# Patient Record
Sex: Male | Born: 1980
Health system: Southern US, Community
[De-identification: ages and names within clinical notes are randomized; demographics above are authoritative.]

## PROBLEM LIST (undated history)

## (undated) DIAGNOSIS — I10 Essential (primary) hypertension: Secondary | ICD-10-CM

## (undated) DIAGNOSIS — F988 Other specified behavioral and emotional disorders with onset usually occurring in childhood and adolescence: Secondary | ICD-10-CM

## (undated) DIAGNOSIS — Z8659 Personal history of other mental and behavioral disorders: Secondary | ICD-10-CM

## (undated) DIAGNOSIS — J9801 Acute bronchospasm: Secondary | ICD-10-CM

## (undated) DIAGNOSIS — Z9109 Other allergy status, other than to drugs and biological substances: Secondary | ICD-10-CM

## (undated) HISTORY — DX: Essential (primary) hypertension: I10

## (undated) HISTORY — DX: Acute bronchospasm: J98.01

## (undated) HISTORY — DX: Personal history of other mental and behavioral disorders: Z86.59

## (undated) HISTORY — DX: Other specified behavioral and emotional disorders with onset usually occurring in childhood and adolescence: F98.8

## (undated) HISTORY — DX: Other allergy status, other than to drugs and biological substances: Z91.09

---
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
## 2003-03-29 ENCOUNTER — Emergency Department (HOSPITAL_COMMUNITY): Admission: AD | Admit: 2003-03-29 | Discharge: 2003-03-29 | Payer: Self-pay | Admitting: Family Medicine

## 2003-03-31 ENCOUNTER — Emergency Department (HOSPITAL_COMMUNITY): Admission: AD | Admit: 2003-03-31 | Discharge: 2003-03-31 | Payer: Self-pay | Admitting: Family Medicine

## 2003-06-06 ENCOUNTER — Emergency Department (HOSPITAL_COMMUNITY): Admission: AD | Admit: 2003-06-06 | Discharge: 2003-06-06 | Payer: Self-pay | Admitting: Family Medicine

## 2003-09-17 ENCOUNTER — Emergency Department (HOSPITAL_COMMUNITY): Admission: EM | Admit: 2003-09-17 | Discharge: 2003-09-17 | Payer: Self-pay | Admitting: Family Medicine

## 2003-12-05 ENCOUNTER — Emergency Department (HOSPITAL_COMMUNITY): Admission: EM | Admit: 2003-12-05 | Discharge: 2003-12-05 | Payer: Self-pay | Admitting: Emergency Medicine

## 2005-08-16 ENCOUNTER — Ambulatory Visit: Payer: Self-pay | Admitting: Internal Medicine

## 2005-11-05 ENCOUNTER — Emergency Department (HOSPITAL_COMMUNITY): Admission: EM | Admit: 2005-11-05 | Discharge: 2005-11-05 | Payer: Self-pay | Admitting: Family Medicine

## 2005-12-14 ENCOUNTER — Ambulatory Visit: Payer: Self-pay | Admitting: Internal Medicine

## 2005-12-20 ENCOUNTER — Ambulatory Visit: Payer: Self-pay | Admitting: Internal Medicine

## 2005-12-30 ENCOUNTER — Ambulatory Visit: Payer: Self-pay | Admitting: Internal Medicine

## 2007-03-27 ENCOUNTER — Telehealth (INDEPENDENT_AMBULATORY_CARE_PROVIDER_SITE_OTHER): Payer: Self-pay | Admitting: *Deleted

## 2007-03-27 ENCOUNTER — Emergency Department (HOSPITAL_COMMUNITY): Admission: EM | Admit: 2007-03-27 | Discharge: 2007-03-27 | Payer: Self-pay | Admitting: Emergency Medicine

## 2007-09-25 ENCOUNTER — Telehealth (INDEPENDENT_AMBULATORY_CARE_PROVIDER_SITE_OTHER): Payer: Self-pay | Admitting: *Deleted

## 2008-03-13 ENCOUNTER — Telehealth (INDEPENDENT_AMBULATORY_CARE_PROVIDER_SITE_OTHER): Payer: Self-pay | Admitting: *Deleted

## 2008-03-24 ENCOUNTER — Ambulatory Visit: Payer: Self-pay | Admitting: Internal Medicine

## 2008-03-24 DIAGNOSIS — F909 Attention-deficit hyperactivity disorder, unspecified type: Secondary | ICD-10-CM

## 2008-03-24 DIAGNOSIS — F419 Anxiety disorder, unspecified: Secondary | ICD-10-CM

## 2008-03-24 DIAGNOSIS — F329 Major depressive disorder, single episode, unspecified: Secondary | ICD-10-CM

## 2008-03-24 DIAGNOSIS — I10 Essential (primary) hypertension: Secondary | ICD-10-CM

## 2008-03-24 DIAGNOSIS — J309 Allergic rhinitis, unspecified: Secondary | ICD-10-CM

## 2008-03-24 HISTORY — DX: Allergic rhinitis, unspecified: J30.9

## 2008-03-24 HISTORY — DX: Essential (primary) hypertension: I10

## 2008-03-24 HISTORY — DX: Anxiety disorder, unspecified: F41.9

## 2008-03-24 HISTORY — DX: Attention-deficit hyperactivity disorder, unspecified type: F90.9

## 2008-05-05 ENCOUNTER — Ambulatory Visit: Payer: Self-pay | Admitting: Internal Medicine

## 2008-05-05 DIAGNOSIS — H6120 Impacted cerumen, unspecified ear: Secondary | ICD-10-CM | POA: Insufficient documentation

## 2008-05-10 LAB — CONVERTED CEMR LAB
AST: 22 units/L (ref 0–37)
Basophils Absolute: 0 10*3/uL (ref 0.0–0.1)
Basophils Relative: 0 % (ref 0.0–3.0)
Calcium: 9 mg/dL (ref 8.4–10.5)
Creatinine, Ser: 0.7 mg/dL (ref 0.4–1.5)
Eosinophils Absolute: 0.1 10*3/uL (ref 0.0–0.7)
GFR calc Af Amer: 171 mL/min
GFR calc non Af Amer: 142 mL/min
Hemoglobin: 15.6 g/dL (ref 13.0–17.0)
MCHC: 33.9 g/dL (ref 30.0–36.0)
MCV: 91.6 fL (ref 78.0–100.0)
Neutro Abs: 5.9 10*3/uL (ref 1.4–7.7)
Neutrophils Relative %: 69.2 % (ref 43.0–77.0)
RBC: 5.03 M/uL (ref 4.22–5.81)

## 2008-09-03 ENCOUNTER — Telehealth: Payer: Self-pay | Admitting: Internal Medicine

## 2010-08-13 NOTE — Assessment & Plan Note (Signed)
[facility]                                 ON-CALL NOTE   STONEWALL, DOSS                     MRN:          [id number]  DATE:04/20/2006                            DOB:          06-Sep-1980    Time of interaction at 10:15 p.m.  The phone number is [phone].  The  caller was Jarin Cornfield, mother.  The patient is a 30 year old male  who came home from work not feeling well.  He is achy, has fevers and  chills, no cough.  There has been no runny nose.  Temperature of 101 and  was given Thera-Flu.  It went up to 102, finally given another dose 4  hours later.  The Thera-Flu has acetaminophen 650, pheniramine 20 mg and  phenylephrine 10 mg.  He has a sore throat and is very achy, with no  cough and no runny nose, just freezing to death and feels poorly.  The  mom called because she was worried about his fever, as is his sister,  who is a Engineer, civil (consulting) and told them to take him to the emergency room.   ASSESSMENT:  It is a probable viral URI.   PLAN:  We will give him Tylenol regular 650 mg tablets every 4 hours,  gargle aggressively every half hour tonight with warm salt water, drink  lots of fluids, rest as much as possible, and if no improvement or gets  worse call in the morning for an appointment.  Primary care Addalynn Kumari is  Dr. Drue Novel, home office [place].     Arta Silence, MD  Electronically Signed    RNS/MedQ  DD: 04/20/2006  DT: 04/20/2006  Job #: [phone] [phone]

## 2012-04-20 ENCOUNTER — Encounter: Payer: Self-pay | Admitting: Internal Medicine

## 2012-04-20 DIAGNOSIS — Z0289 Encounter for other administrative examinations: Secondary | ICD-10-CM

## 2012-06-26 ENCOUNTER — Ambulatory Visit (INDEPENDENT_AMBULATORY_CARE_PROVIDER_SITE_OTHER): Payer: BC Managed Care – PPO | Admitting: Internal Medicine

## 2012-06-26 ENCOUNTER — Encounter: Payer: Self-pay | Admitting: Internal Medicine

## 2012-06-26 VITALS — BP 138/84 | HR 76 | Ht 67.0 in | Wt 256.0 lb

## 2012-06-26 DIAGNOSIS — Z0000 Encounter for general adult medical examination without abnormal findings: Secondary | ICD-10-CM

## 2012-06-26 DIAGNOSIS — F329 Major depressive disorder, single episode, unspecified: Secondary | ICD-10-CM

## 2012-06-26 DIAGNOSIS — I10 Essential (primary) hypertension: Secondary | ICD-10-CM

## 2012-06-26 DIAGNOSIS — Z23 Encounter for immunization: Secondary | ICD-10-CM

## 2012-06-26 HISTORY — DX: Encounter for general adult medical examination without abnormal findings: Z00.00

## 2012-06-26 NOTE — Patient Instructions (Addendum)
Come back fasting: CMP CBC TSH FLP--- dx v70 ---- Exercise 3 hour a week

## 2012-06-26 NOTE — Assessment & Plan Note (Addendum)
Td 2003 and today EKG nsr Labs Aware his main problem is obesity also  he has a + FH CAD diet-exercise : advise about a healthy consistent diet, exercise 3 hours/week and refer to a nutritionist BMI discussed

## 2012-06-26 NOTE — Assessment & Plan Note (Signed)
Resolved

## 2012-06-26 NOTE — Assessment & Plan Note (Signed)
H/o HTN, doing well on no meds

## 2012-06-26 NOTE — Progress Notes (Signed)
  Subjective:    Patient ID: Mathew Daniels, male    DOB: 06-03-1980, 32 y.o.   MRN: [id number]  HPI CPX, new pt, last visit > 3 years ago  Past Medical History  Diagnosis Date  . ADD   . HTN   . H/O: depression   . Bronchospasm   . Environmental allergies    Past Surgical History  Procedure Laterality Date  . Tendon repair Right 1998    ankle   History   Social History  . Marital Status: Single    Spouse Name: N/A    Number of Children: 1  . Years of Education: N/A   Occupational History  . finances     Social History Main Topics  . Smoking status: Former Games developer  . Smokeless tobacco: Former Neurosurgeon     Comment: quit smoking 2012, quit chewing 2014  . Alcohol Use: Yes     Comment: socially  . Drug Use: No  . Sexually Active: Not on file   Other Topics Concern  . Not on file   Social History Narrative   Married, 1 daughter born 2012 , 2 step kids   Diet-exercise: fluctuates, large variations of weight, poor portion control              Review of Systems  Respiratory: Negative for cough and shortness of breath.   Cardiovascular: Negative for chest pain and leg swelling.  Gastrointestinal: Negative for abdominal pain and blood in stool.  Genitourinary: Negative for dysuria and hematuria.  Psychiatric/Behavioral:       Currently no depression       Objective:   Physical Exam BP 138/84  Pulse 76  Ht 5\' 7"  (1.702 m)  Wt 256 lb (116.121 kg)  BMI 40.09 kg/m2  SpO2 96%  General -- alert, well-developed, BMI 40, + central obesity.   Neck --no thyromegaly , normal carotid pulse Lungs -- normal respiratory effort, no intercostal retractions, no accessory muscle use, and normal breath sounds.   Heart-- normal rate, regular rhythm, no murmur, and no gallop.   Abdomen--soft, non-tender, no distention, no masses, no HSM, no guarding, and no rigidity.   Extremities-- no pretibial edema bilaterally  Neurologic-- alert & oriented X3 and strength normal in all  extremities. Psych-- Cognition and judgment appear intact. Alert and cooperative with normal attention span and concentration.  not anxious appearing and not depressed appearing.      Assessment & Plan:

## 2012-06-29 ENCOUNTER — Other Ambulatory Visit (INDEPENDENT_AMBULATORY_CARE_PROVIDER_SITE_OTHER): Payer: BC Managed Care – PPO

## 2012-06-29 DIAGNOSIS — Z0000 Encounter for general adult medical examination without abnormal findings: Secondary | ICD-10-CM

## 2012-06-29 LAB — LIPID PANEL
Cholesterol: 140 mg/dL (ref 0–200)
LDL Cholesterol: 88 mg/dL (ref 0–99)
Total CHOL/HDL Ratio: 4
VLDL: 14.2 mg/dL (ref 0.0–40.0)

## 2012-06-29 LAB — COMPREHENSIVE METABOLIC PANEL
Alkaline Phosphatase: 67 U/L (ref 39–117)
BUN: 14 mg/dL (ref 6–23)
Glucose, Bld: 99 mg/dL (ref 70–99)
Total Bilirubin: 0.4 mg/dL (ref 0.3–1.2)

## 2012-06-29 LAB — CBC WITH DIFFERENTIAL/PLATELET
Basophils Relative: 0.1 % (ref 0.0–3.0)
Eosinophils Relative: 0.8 % (ref 0.0–5.0)
HCT: 42.1 % (ref 39.0–52.0)
Lymphs Abs: 1.6 10*3/uL (ref 0.7–4.0)
MCV: 85.6 fl (ref 78.0–100.0)
Monocytes Absolute: 0.6 10*3/uL (ref 0.1–1.0)
Monocytes Relative: 4.8 % (ref 3.0–12.0)
Neutrophils Relative %: 80.9 % — ABNORMAL HIGH (ref 43.0–77.0)
RBC: 4.92 Mil/uL (ref 4.22–5.81)
WBC: 12.1 10*3/uL — ABNORMAL HIGH (ref 4.5–10.5)

## 2012-07-04 ENCOUNTER — Encounter: Payer: Self-pay | Admitting: *Deleted

## 2012-07-13 ENCOUNTER — Ambulatory Visit: Payer: BC Managed Care – PPO | Admitting: Dietician

## 2012-08-06 ENCOUNTER — Encounter: Payer: Self-pay | Admitting: *Deleted

## 2012-08-06 ENCOUNTER — Encounter: Payer: BC Managed Care – PPO | Attending: Internal Medicine | Admitting: *Deleted

## 2012-08-06 DIAGNOSIS — E663 Overweight: Secondary | ICD-10-CM | POA: Insufficient documentation

## 2012-08-06 DIAGNOSIS — Z713 Dietary counseling and surveillance: Secondary | ICD-10-CM | POA: Insufficient documentation

## 2012-08-06 NOTE — Progress Notes (Signed)
Medical Nutrition Therapy:  Appt start time: 1030 end time:  1130.  Assessment:  Primary concern today: Weight management. Patient states a desire for weight loss in order to improve health/set a good example for his kids. He has a history of weight loss using P90x exercise program several years ago, losing 25 pounds in 2 months, but has since regained weight. He just starting using a Ninja blender this week, drinking 2-3 smoothies daily. He has also reduced carbohydrate intake and eliminated soda. He has lost 6 pounds over 1 week. He does report that he sometimes feels light-headed during the day. His biggest issues with eating include, large portions, inconsistent food intake, eating out, and all-or-nothing mind set.   WEIGHT: 250.8 pounds BMI: 39.4  MEDICATIONS: None   DIETARY INTAKE:   Usual eating pattern includes 3 meals and 1-2 snacks per day.  24-hr recall:  B ( AM): Oatmeal, berries, banana, almond milk  Snk ( AM): Sometimes, reduced fat Cheez-its  L ( PM): Smoothie (spinach, Greek yogurt, berries, almond milk, protein powder) Snk ( PM): Same as above D ( PM): Same as lunch, or chicken with vegetables Snk ( PM): None Beverages: water  Usual physical activity: 25 minute workout video (high intensity cardio) 5 days weekly.   Estimated energy needs: 1800 calories 225 g carbohydrates 113 g protein 50 g fat  Progress Towards Goal(s):  In progress.   Nutritional Diagnosis:  [place]-3.3 Overweight/obesity As related to history of excessive energy intake.  As evidenced by BMI 39.4.    Intervention:  Nutrition counseling. We discussed strategies for weight loss, including balancing nutrients (carbs, protein, fat), portion control, healthy snacks, and exercise. Patient was advised about how to use moderation when eating in order to prevent all-or-nothing mind set.   Goals:  1. 1 pound weight loss per week. Goal weight of 240 pounds by next visit.  2. Monitor portion size of food.  3.  Balance nutrients at meals (moderate lean protein, healthy carbs, half plate non-starchy vegetables).  4. Work on exercising 20-30 minutes 5 days weekly.   Handouts given during visit include:  Meal planning card  Monitoring/Evaluation:  Dietary intake, exercise, and body weight in 2 month(s).

## 2012-10-22 ENCOUNTER — Ambulatory Visit: Payer: BC Managed Care – PPO | Admitting: *Deleted

## 2013-01-07 ENCOUNTER — Ambulatory Visit (INDEPENDENT_AMBULATORY_CARE_PROVIDER_SITE_OTHER): Payer: BC Managed Care – PPO | Admitting: Internal Medicine

## 2013-01-07 ENCOUNTER — Encounter: Payer: Self-pay | Admitting: Internal Medicine

## 2013-01-07 VITALS — BP 152/95 | HR 62 | Temp 98.4°F | Wt 255.8 lb

## 2013-01-07 DIAGNOSIS — I10 Essential (primary) hypertension: Secondary | ICD-10-CM

## 2013-01-07 DIAGNOSIS — F329 Major depressive disorder, single episode, unspecified: Secondary | ICD-10-CM

## 2013-01-07 DIAGNOSIS — Z23 Encounter for immunization: Secondary | ICD-10-CM

## 2013-01-07 NOTE — Progress Notes (Signed)
  Subjective:    Patient ID: Mathew Daniels, male    DOB: 10/02/1980, 32 y.o.   MRN: [id number]  HPI Routine office visit Since the last time he was here, he did great with diet and exercise, weight decreased to 242 pounds. Her last few months he has been under a lot of stress mostly due to family issues (daughter with anorexia). Has not been able to follow  through with his healthier lifestyle  Past Medical History  Diagnosis Date  . ADD   . HTN   . H/O: depression   . Bronchospasm   . Environmental allergies    / Past Surgical History  Procedure Laterality Date  . Tendon repair Right 1998    ankle   History   Social History  . Marital Status: Single    Spouse Name: N/A    Number of Children: 1  . Years of Education: N/A   Occupational History  . finances     Social History Main Topics  . Smoking status: Former Games developer  . Smokeless tobacco: Former Neurosurgeon     Comment: quit smoking 2012, quit chewing 2014  . Alcohol Use: Yes     Comment: socially  . Drug Use: No  . Sexual Activity: Not on file   Other Topics Concern  . Not on file   Social History Narrative   Married, 1 daughter born 2012 , 2 step kids                  Review of Systems BP today slightly elevated, not ambulatory BPs. From time to time has difficulty sleeping    Objective:   Physical Exam BP 152/95  Pulse 62  Temp(Src) 98.4 F (36.9 C)  Wt 255 lb 12.8 oz (116.03 kg)  BMI 40.05 kg/m2  SpO2 96% General -- alert, well-developed, NAD.   Lungs -- normal respiratory effort, no intercostal retractions, no accessory muscle use, and normal breath sounds.  Heart-- normal rate, regular rhythm, no murmur.  Extremities-- no pretibial edema bilaterally   Psych-- Cognition and judgment appear intact. Cooperative with normal attention span and concentration. No anxious appearing , no depressed appearing.    Assessment & Plan:  Today , I spent more than 15 min with the patient, >50% of the time  counseling

## 2013-01-07 NOTE — Assessment & Plan Note (Signed)
BP elevated today, taking no medications, encourage To take his ambulatory BPs. He was doing very well with diet and exercise up to a few months ago, encouraged to go back to a healthier lifestyle

## 2013-01-07 NOTE — Assessment & Plan Note (Signed)
Currently under a lot of stress mostly related to his daughter's problems. Counseled to the best of my ability

## 2013-01-07 NOTE — Patient Instructions (Signed)
Check the  blood pressure 2 or 3 times a week, be sure it is between 110/60 and 140/85. If it is consistently higher or lower, let me know  

## 2013-07-05 ENCOUNTER — Telehealth: Payer: Self-pay

## 2013-07-05 NOTE — Telephone Encounter (Signed)
Medication List and allergies:  Reviewed and updated(takes no meds)  90 day supply/mail order: na [facility] [address]  Immunizations due: UTD  A/P:   No changes to FH, PSH or Personal Hx Flu vaccine--10/2013 Tdap--06/2012 Father passed away from DM incident  To Discuss with Provider: Not at this time

## 2013-07-08 ENCOUNTER — Encounter: Payer: BC Managed Care – PPO | Admitting: Internal Medicine

## 2013-08-01 ENCOUNTER — Ambulatory Visit (INDEPENDENT_AMBULATORY_CARE_PROVIDER_SITE_OTHER): Payer: BC Managed Care – PPO | Admitting: Physician Assistant

## 2013-08-01 ENCOUNTER — Encounter: Payer: Self-pay | Admitting: Physician Assistant

## 2013-08-01 VITALS — BP 158/102 | HR 85 | Temp 97.9°F | Resp 16 | Ht 67.0 in | Wt 257.2 lb

## 2013-08-01 DIAGNOSIS — J309 Allergic rhinitis, unspecified: Secondary | ICD-10-CM

## 2013-08-01 DIAGNOSIS — J029 Acute pharyngitis, unspecified: Secondary | ICD-10-CM

## 2013-08-01 HISTORY — DX: Allergic rhinitis, unspecified: J30.9

## 2013-08-01 MED ORDER — FLUTICASONE PROPIONATE 50 MCG/ACT NA SUSP: 2.0000 | Freq: Every day | NASAL | Status: DC

## 2013-08-01 NOTE — Progress Notes (Signed)
Pre visit review using our clinic review tool, if applicable. No additional management support is needed unless otherwise documented below in the visit note/SLS  

## 2013-08-01 NOTE — Patient Instructions (Signed)
Your symptoms seem most likely allergic in nature.  Increase fluid intake.  Alternate tylenol and ibuprofen if needed for throat irritation.  Your strep test was negative but I will send for culture.  Start Allegra daily.  Start using Flonase daily.  Place a humidifier in the bedroom.  Call or return to clinic if symptoms are not improving.

## 2013-08-01 NOTE — Progress Notes (Signed)
Patient presents to clinic today c/o runny nose, post-nasal drip and scratchy throat intermittently over the past 3-4 weeks.  Denies sinus pressure, sinus pain, cough, chest congestion. Denies fever, chills, fatigue.  Has hx of seasonal allergies but does not take a daily medication.    Past Medical History  Diagnosis Date  . ADD   . HTN   . H/O: depression   . Bronchospasm   . Environmental allergies     No current outpatient prescriptions on file prior to visit.   No current facility-administered medications on file prior to visit.    Allergies  Allergen Reactions  . Amoxicillin     Blisters feet hand July 2013, took amoxicillin before w/o problems  . Carvedilol     REACTION: NAUSEA    Family History  Problem Relation Age of Onset  . Colon cancer Neg Hx   . Cancer - Prostate Neg Hx   . CAD Father [id number]    MI x 2 CABG  . CAD Other     GF MI  . Diabetes Father   . Stroke Father     History   Social History  . Marital Status: Single    Spouse Name: N/A    Number of Children: 1  . Years of Education: N/A   Occupational History  . finances     Social History Main Topics  . Smoking status: Former Games developermoker  . Smokeless tobacco: Former NeurosurgeonUser     Comment: quit smoking 2012, quit chewing 2014  . Alcohol Use: Yes     Comment: socially  . Drug Use: No  . Sexual Activity: None   Other Topics Concern  . None   Social History Narrative   Married, 1 daughter born 2012 , 2 step kids                  Review of Systems - See HPI.  All other ROS are negative.  BP 158/102  Pulse 85  Temp(Src) 97.9 F (36.6 C) (Oral)  Resp 16  Ht 5\' 7"  (1.702 m)  Wt 257 lb 4 oz (116.688 kg)  BMI 40.28 kg/m2  SpO2 98%  Physical Exam  Vitals reviewed. Constitutional: He is oriented to person, place, and time and well-developed, well-nourished, and in no distress.  HENT:  Head: Normocephalic and atraumatic.  Right Ear: External ear normal.  Left Ear: External ear normal.   Nose: Nose normal.  Mouth/Throat: Oropharynx is clear and moist. No oropharyngeal exudate.  Eyes: Conjunctivae and EOM are normal. Pupils are equal, round, and reactive to light.  Neck: Neck supple.  Cardiovascular: Normal rate, regular rhythm, normal heart sounds and intact distal pulses.   Pulmonary/Chest: Effort normal and breath sounds normal. No respiratory distress. He has no wheezes. He has no rales. He exhibits no tenderness.  Neurological: He is alert and oriented to person, place, and time. No cranial nerve deficit.  Skin: Skin is warm and dry. No rash noted.  Psychiatric: Affect normal.   Assessment/Plan: Allergic rhinitis Rapid Strap negative. Daily allegra.  Rx Flonase.  Increase fluids.  Humidifier in bedroom.  Call or return to clinic if symptoms are not improving.

## 2013-08-01 NOTE — Assessment & Plan Note (Signed)
Rapid Strap negative. Daily allegra.  Rx Flonase.  Increase fluids.  Humidifier in bedroom.  Call or return to clinic if symptoms are not improving.

## 2013-08-03 LAB — CULTURE, GROUP A STREP: ORGANISM ID, BACTERIA: NORMAL

## 2013-09-03 ENCOUNTER — Ambulatory Visit (HOSPITAL_BASED_OUTPATIENT_CLINIC_OR_DEPARTMENT_OTHER)
Admission: RE | Admit: 2013-09-03 | Discharge: 2013-09-03 | Disposition: A | Discharge status: Home / Self Care | Payer: BC Managed Care – PPO | Source: Ambulatory Visit | Attending: Internal Medicine | Admitting: Internal Medicine

## 2013-09-03 ENCOUNTER — Encounter: Payer: Self-pay | Admitting: Internal Medicine

## 2013-09-03 ENCOUNTER — Ambulatory Visit (INDEPENDENT_AMBULATORY_CARE_PROVIDER_SITE_OTHER): Payer: BC Managed Care – PPO | Admitting: Internal Medicine

## 2013-09-03 ENCOUNTER — Encounter (HOSPITAL_BASED_OUTPATIENT_CLINIC_OR_DEPARTMENT_OTHER): Payer: Self-pay

## 2013-09-03 VITALS — BP 144/91 | HR 90 | Temp 99.2°F | Wt 252.0 lb

## 2013-09-03 DIAGNOSIS — J039 Acute tonsillitis, unspecified: Secondary | ICD-10-CM

## 2013-09-03 DIAGNOSIS — J3501 Chronic tonsillitis: Secondary | ICD-10-CM | POA: Insufficient documentation

## 2013-09-03 DIAGNOSIS — J36 Peritonsillar abscess: Secondary | ICD-10-CM | POA: Insufficient documentation

## 2013-09-03 MED ORDER — IOHEXOL 300 MG/ML  SOLN
75.0000 mL | Freq: Once | INTRAMUSCULAR | Status: AC | PRN
  Administered 2013-09-03: 75 mL via INTRAVENOUS

## 2013-09-03 MED ORDER — PREDNISONE 10 MG PO TABS: ORAL_TABLET | ORAL | Status: DC

## 2013-09-03 MED ORDER — AZITHROMYCIN 250 MG PO TABS: ORAL_TABLET | ORAL | Status: DC

## 2013-09-03 NOTE — Progress Notes (Signed)
Pre visit review using our clinic review tool, if applicable. No additional management support is needed unless otherwise documented below in the visit note. 

## 2013-09-03 NOTE — Patient Instructions (Signed)
Get your blood work before you leave   Will get a CT  Continue with antibiotics  ER if severe symptoms, difficulty breathing or swallowing  Come back in 1 week

## 2013-09-03 NOTE — Progress Notes (Signed)
Subjective:    Patient ID: Mathew Daniels, male    DOB: 12/18/1980, 33 y.o.   MRN: [id number]  DOS:  09/03/2013 Type of  Visit:  Acute visit History:  Micah FlesherWent to the urgent care 08/26/2013 w/ one-day history of sore throat, fever, aches, nausea. Tested positive for strep, was prescribed Zithromax, got significantly better. Symptoms resurface 08/30/2013, the next day went back to the clinic, another strep test was positive and   was prescribed clindamycin. Is here because he is not completely well: Having severe night sweats, subjective fever, still hurting when he swallows, body aches have decreased.    ROS No sinus pain or congestion No chest pain or difficulty breathing Mild cough with no sputum production. No chest pain. No drooling.  Past Medical History  Diagnosis Date  . ADD   . HTN   . H/O: depression   . Bronchospasm   . Environmental allergies     Past Surgical History  Procedure Laterality Date  . Tendon repair Right 1998    ankle    History   Social History  . Marital Status: Single    Spouse Name: N/A    Number of Children: 1  . Years of Education: N/A   Occupational History  . finances     Social History Main Topics  . Smoking status: Former Games developermoker  . Smokeless tobacco: Former NeurosurgeonUser     Comment: quit smoking 2012, quit chewing 2014  . Alcohol Use: Yes     Comment: socially  . Drug Use: No  . Sexual Activity: Not on file   Other Topics Concern  . Not on file   Social History Narrative   Married, 1 daughter born 2012 , 2 step kids                       Medication List       This list is accurate as of: 09/03/13  7:45 PM.  Always use your most recent med list.               azithromycin 250 MG tablet  Commonly known as:  ZITHROMAX Z-PAK  As directed     fluticasone 50 MCG/ACT nasal spray  Commonly known as:  FLONASE  Place 2 sprays into both nostrils daily.     Kelp Tabs  Take 1 each by mouth daily.     multivitamin  tablet  Take 1 tablet by mouth daily. Optimen           Objective:   Physical Exam BP 144/91  Pulse 90  Temp(Src) 99.2 F (37.3 C)  Wt 252 lb (114.306 kg)  SpO2 98% General -- alert, well-developed, NAD.  Neck --neck symmetric, no mass, no TTP HEENT-- Not pale. TMs normal L, R obscured by wax Tthroat  Tonsils quite enlarged, R sli isghtly bigger with abundant   discharge. The left one is red without much discharge. Uvula seems midline. No difficulty breathing or swallowing, no drooling Face symmetric, sinuses not tender to palpation. Nose not congested.  Lungs -- normal respiratory effort, no intercostal retractions, no accessory muscle use, and normal breath sounds.  Heart-- normal rate, regular rhythm, no murmur.   Extremities-- no pretibial edema bilaterally  Neurologic--  alert & oriented X3. Speech normal, gait appropriate for age, strength symmetric and appropriate for age.  Psych-- Cognition and judgment appear intact. Cooperative with normal attention span and concentration. No anxious or depressed appearing.  Assessment & Plan:  Acute tonsillitis, Acute tonsillitis, status post Zithromax and now on clindamycin for 3 days. He feels slightly better however he continue with the symptoms particularly chills at night. DDX includes a strep infection, viral, mono Due to persistence of symptoms, is possible he has to undetected peritonsilar abscess. I discussed this informally with a ENT colleague, a reasonable approach would be to do a CT and if there is no abscess proceed with antibiotics and a low dose of steroids. Plan: CT of the neck Labs-- Mono, throat cx ER if symptoms severe If CT negative , prednisone in addition to recommend antibiotics RTC 1 week Addendum -- would like another zpack, rx sent  --CT neg, LMOM : plan is the same, prednisone sent to his pharmacy  Today , I spent more than  40  min with the patient: >50% of the time counseling regards the  treatment, reasoning behind my advise ans also coordinating his care

## 2013-09-04 LAB — MONONUCLEOSIS SCREEN: MONO SCREEN: NEGATIVE

## 2013-09-05 LAB — CULTURE, GROUP A STREP: Organism ID, Bacteria: NORMAL

## 2013-09-09 ENCOUNTER — Encounter: Payer: Self-pay | Admitting: Internal Medicine

## 2013-09-09 ENCOUNTER — Ambulatory Visit (INDEPENDENT_AMBULATORY_CARE_PROVIDER_SITE_OTHER): Payer: BC Managed Care – PPO | Admitting: Internal Medicine

## 2013-09-09 VITALS — BP 148/83 | HR 80 | Temp 98.4°F | Ht 68.0 in | Wt 251.0 lb

## 2013-09-09 DIAGNOSIS — I10 Essential (primary) hypertension: Secondary | ICD-10-CM

## 2013-09-09 DIAGNOSIS — Z0000 Encounter for general adult medical examination without abnormal findings: Secondary | ICD-10-CM

## 2013-09-09 NOTE — Patient Instructions (Signed)
Schedule labs for next week, fasting FLP, BMP, CBC-- dx V70   If you need more information about a healthy diet, family history of heart disease  visit  the American Heart Association, it  is a Radiographer, therapeuticgreat resource online at:  [email or web address]    Next visit 1 year

## 2013-09-09 NOTE — Assessment & Plan Note (Signed)
BP 148/83, recommend diet and exercise, recheck next year

## 2013-09-09 NOTE — Assessment & Plan Note (Signed)
Td 2014 Labs Aware his main problem is obesity also  he has a + FH CAD; Diet and exercise discussed Concerned about a mole, he already has an appointment to see a skin doctor

## 2013-09-09 NOTE — Progress Notes (Signed)
Subjective:    Patient ID: Mathew Daniels, male    DOB: 10/04/1980, 33 y.o.   MRN: [id number]  DOS:  09/09/2013 Type of  Visit: CPX     ROS Diet, Exercise--  Did well, regained wt, ready to go back to a healthier life style No  CP, SOB Denies  nausea, vomiting diarrhea, blood in the stools (-) wheezing, chest congestion No dysuria, gross hematuria, difficulty urinating  No anxiety, depression    Past Medical History  Diagnosis Date  . ADD   . HTN   . H/O: depression   . Bronchospasm   . Environmental allergies     Past Surgical History  Procedure Laterality Date  . Tendon repair Right 1998    ankle    History   Social History  . Marital Status: Single    Spouse Name: N/A    Number of Children: 1  . Years of Education: N/A   Occupational History  . finances     Social History Main Topics  . Smoking status: Former Games developermoker  . Smokeless tobacco: Former NeurosurgeonUser     Comment: quit smoking 2012, quit chewing 2014  . Alcohol Use: Yes     Comment: socially  . Drug Use: No  . Sexual Activity: Not on file   Other Topics Concern  . Not on file   Social History Narrative   Married, 1 daughter born 2012 , 2 step kids                  Family History  Problem Relation Age of Onset  . Colon cancer Neg Hx   . Cancer - Prostate Neg Hx   . CAD Father [id number]    MI x 2 CABG  . CAD Other     GF MI  . Diabetes Father   . Stroke Father          Medication List       This list is accurate as of: 09/09/13 11:59 PM.  Always use your most recent med list.               Kelp Tabs  Take 1 each by mouth daily.     multivitamin tablet  Take 1 tablet by mouth daily. Optimen     predniSONE 10 MG tablet  Commonly known as:  DELTASONE  3 tabs x 3 days, 2 tabs x 3 days, 1 tab x 3 days           Objective:   Physical Exam BP 148/83  Pulse 80  Temp(Src) 98.4 F (36.9 C) (Oral)  Ht 5\' 8"  (1.727 m)  Wt 251 lb (113.853 kg)  BMI 38.17 kg/m2  SpO2  97%  General -- alert, well-developed, NAD.  Neck --no thyromegaly  HEENT-- Not pale. ; throat symmetric, no redness or discharge.  Lungs -- normal respiratory effort, no intercostal retractions, no accessory muscle use, and normal breath sounds.  Heart-- normal rate, regular rhythm, no murmur.  Abdomen-- Not distended, good bowel sounds,soft, non-tender.  Extremities-- no pretibial edema bilaterally  Neurologic--  alert & oriented X3. Speech normal, gait appropriate for age, strength symmetric and appropriate for age.   Psych-- Cognition and judgment appear intact. Cooperative with normal attention span and concentration. No anxious or depressed appearing.  Skin: Left mid back has a 2.5x0.8 cm here hairy mole     Assessment & Plan:   Tonsillitis, workup was negative, finishing prednisone. Is a lot better.

## 2013-09-09 NOTE — Progress Notes (Signed)
Pre-visit discussion using our clinic review tool. No additional management support is needed unless otherwise documented below in the visit note.  

## 2013-09-10 ENCOUNTER — Telehealth: Payer: Self-pay | Admitting: Internal Medicine

## 2013-09-10 NOTE — Telephone Encounter (Signed)
Relevant patient education assigned to patient using Emmi. ° °

## 2013-09-18 ENCOUNTER — Other Ambulatory Visit: Payer: BC Managed Care – PPO

## 2015-02-02 IMAGING — CT CT NECK W/ CM
4 series · 14 of 33 positions shown, 17 images · IV contrast (omnipaque)
Comparison: None.

CLINICAL DATA: Chronic tonsillitis. Right worse than left sided
tonsillitis. Peritonsillar abscess.

EXAM:
CT NECK WITH CONTRAST
TECHNIQUE: Multidetector CT imaging of the neck was performed using the
standard protocol following the bolus administration of intravenous
contrast.
CONTRAST:  75mL OMNIPAQUE IOHEXOL 300 MG/ML  SOLN

[Series 2: neck 2.0 b31s · axial · 0.51mm/px · z∈[-198,-46]mm · 5 of 114 slices shown, 7 images]
[im 19/114  soft-tissue]
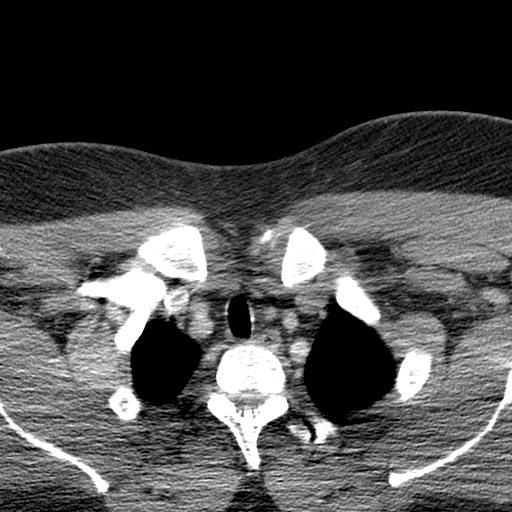
[im 19/114  bone]
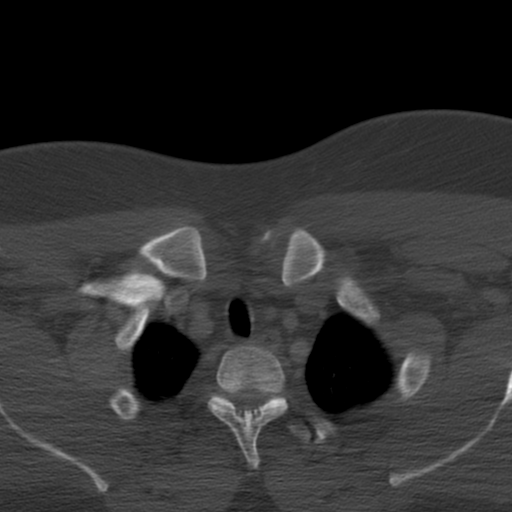
[im 38/114  bone]
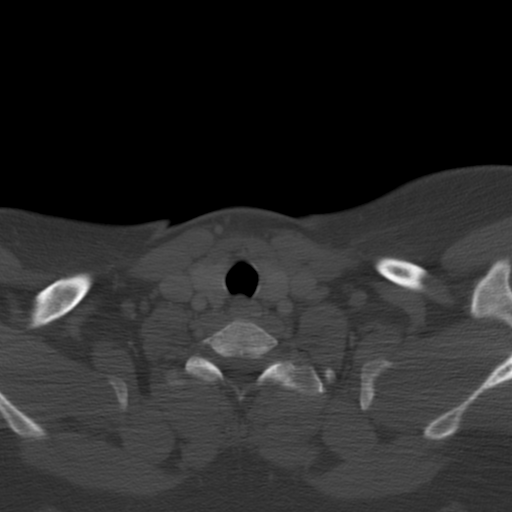
[im 57/114  bone]
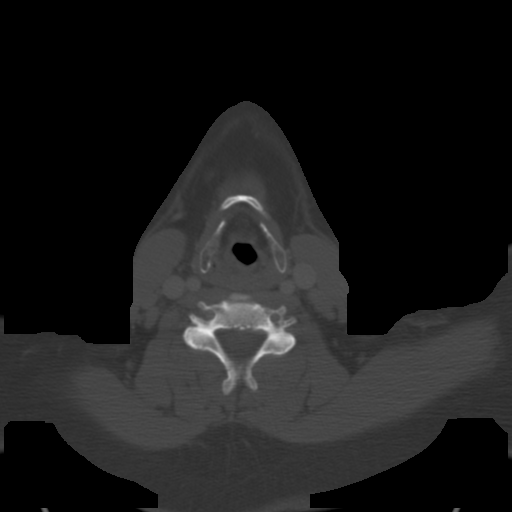
[im 76/114  bone]
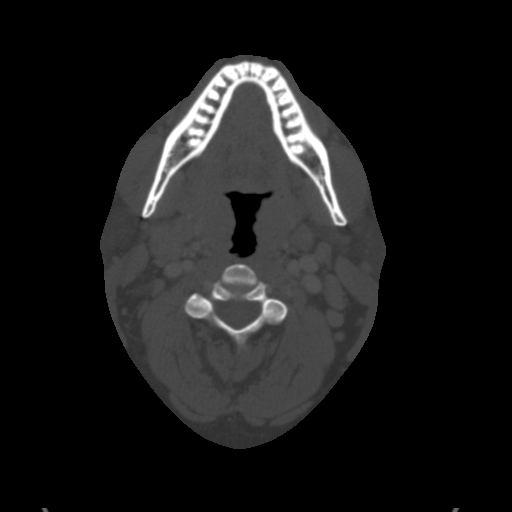
[im 95/114  soft-tissue]
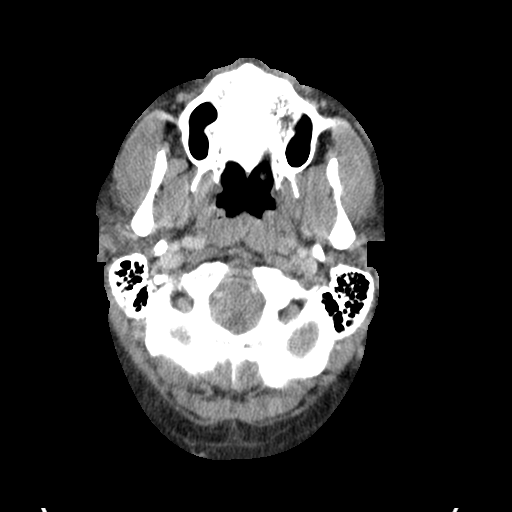
[im 95/114  bone]
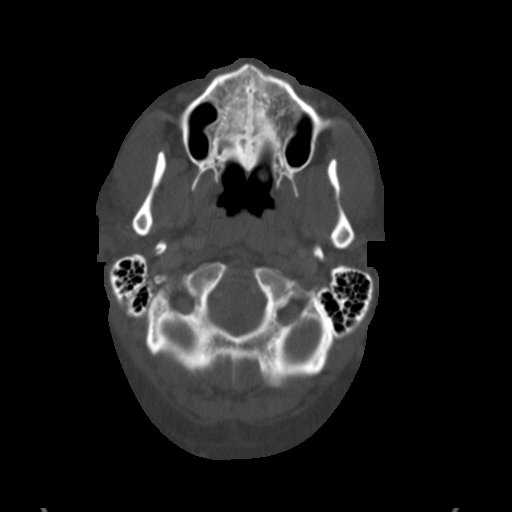

[Series 4: neck 2.0 coronal · coronal · 0.34mm/px · 3 of 125 slices shown]
[im 25/125  bone]
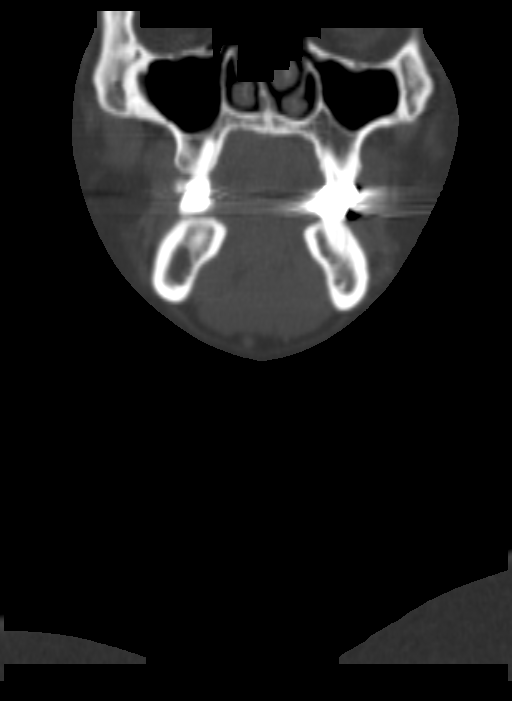
[im 50/125  bone]
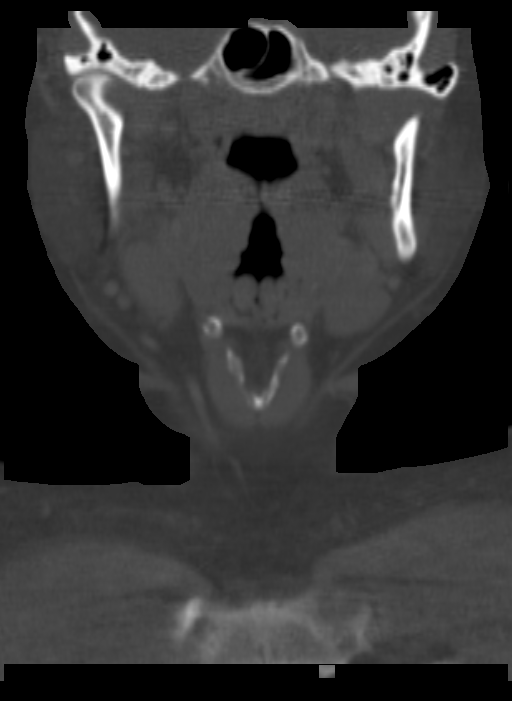
[im 75/125  bone]
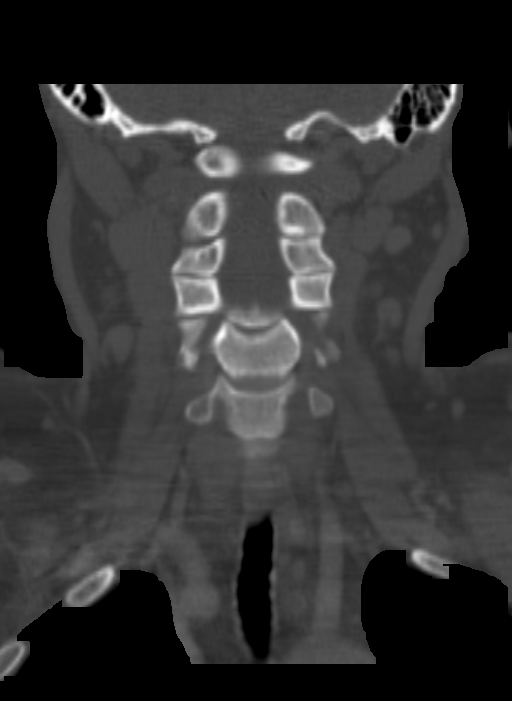

[Series 5: neck 2.0 sagittal · sagittal · 0.47mm/px · 5 of 89 slices shown, 6 images]
[im 30/89  bone]
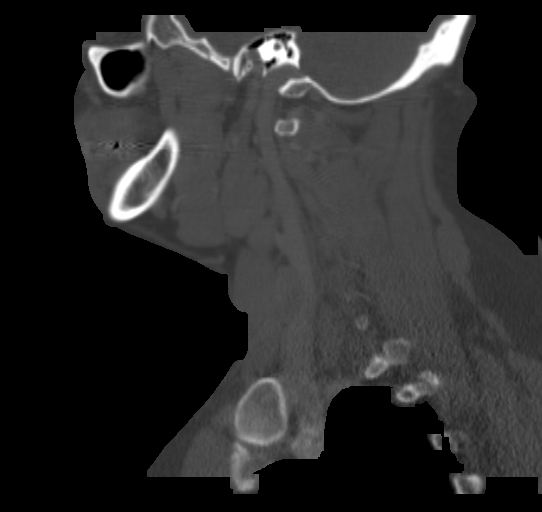
[im 37/89  bone]
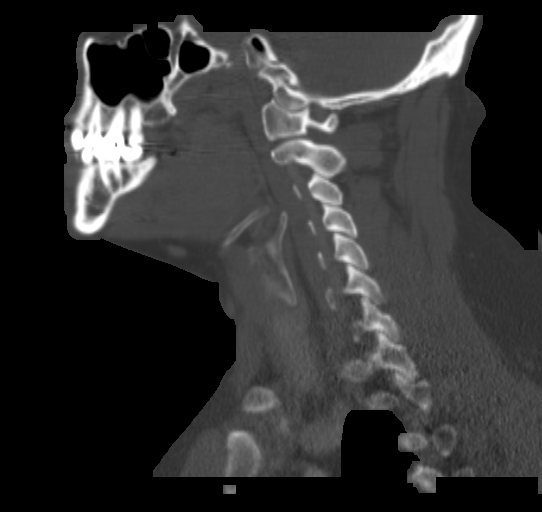
[im 45/89  soft-tissue]
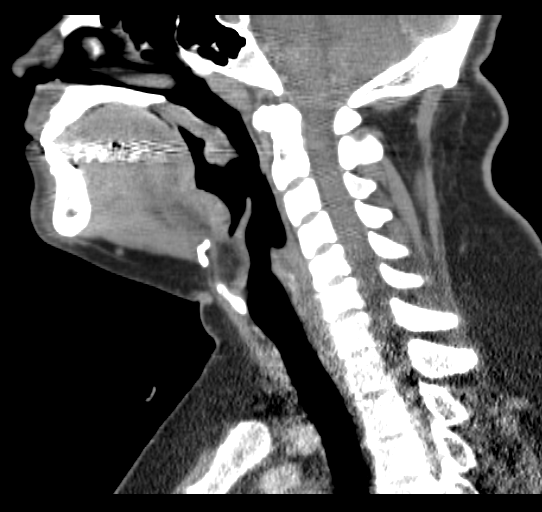
[im 45/89  bone]
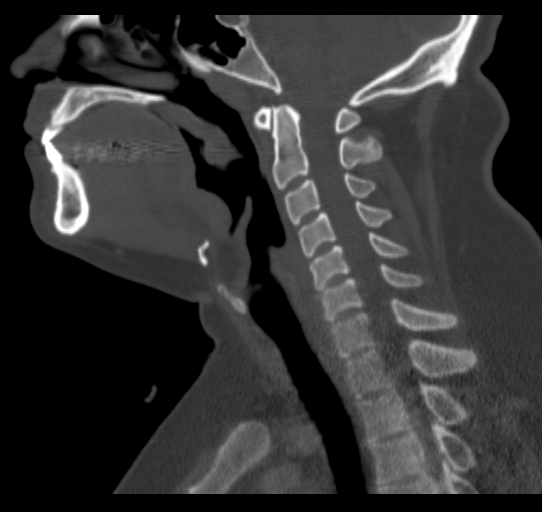
[im 52/89  bone]
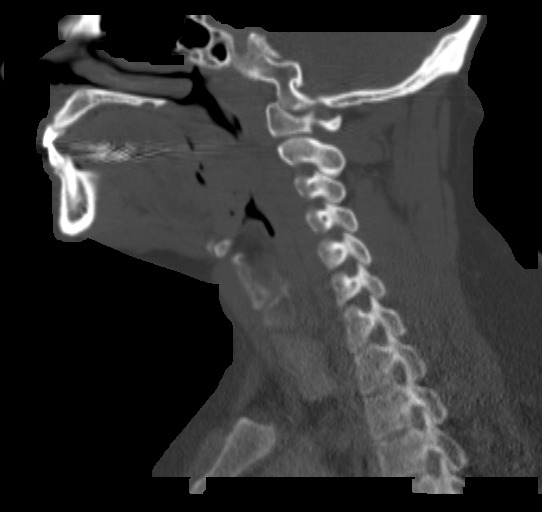
[im 59/89  bone]
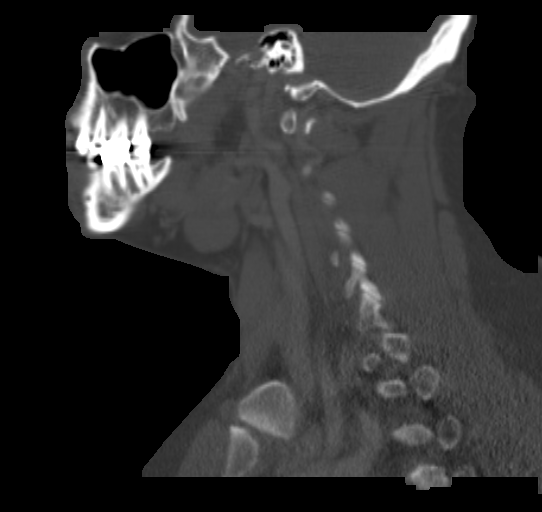

[Series 7: neck 2.0 orth axial to hyoid · axial · 0.37mm/px · 1 of 119 slices shown]
[im 20/119  bone]
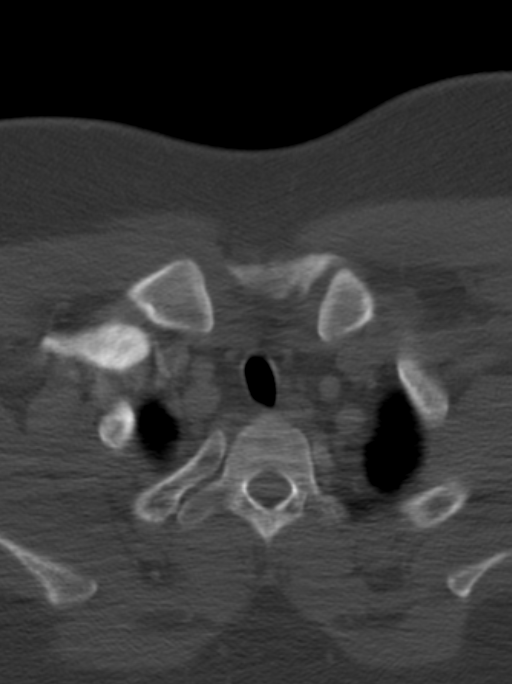

[14 of 33 positions shown; findings below may reference images not displayed]

FINDINGS: There is no peritonsillar abscess. Left-sided level to cervical
adenopathy is present with the largest node measuring 24 mm x 19 mm
(image 41 series 2). The largest left level 2 lymph node measures 20
mm x 13 mm. In the setting of chronic tonsillitis, lymphadenopathy
is likely reactive. Parotid glands appear normal. Submandibular
glands also appear within normal limits. The floor of the mouth is
normal. Visualized paranasal sinuses and mastoid air cells are
within normal limits. Right-greater-than-left palatine tonsillar
enlargement. Thoracic inlet appears within normal limits. No
supraclavicular adenopathy.
IMPRESSION: Negative for peritonsillar abscess. Tonsillar hypertrophy of
[place] ring, greater on the right than the left. Level 2
right-greater-than-left cervical adenopathy is probably reactive in
the setting of chronic tonsillitis.

## 2015-02-11 ENCOUNTER — Telehealth: Payer: Self-pay | Admitting: Internal Medicine

## 2015-02-11 ENCOUNTER — Ambulatory Visit: Payer: Self-pay | Admitting: Internal Medicine

## 2015-02-11 DIAGNOSIS — Z0289 Encounter for other administrative examinations: Secondary | ICD-10-CM

## 2015-02-13 ENCOUNTER — Encounter: Payer: Self-pay | Admitting: Internal Medicine

## 2015-02-13 NOTE — Telephone Encounter (Signed)
Pt was no show 02/11/15 11:15am for acute appt, pt did not reschedule, charge or no charge?

## 2015-02-13 NOTE — Telephone Encounter (Signed)
2nd no show, multiple cancellations. Charge.

## 2015-07-02 DIAGNOSIS — R05 Cough: Secondary | ICD-10-CM | POA: Diagnosis not present

## 2015-07-02 DIAGNOSIS — J069 Acute upper respiratory infection, unspecified: Secondary | ICD-10-CM | POA: Diagnosis not present

## 2015-08-27 DIAGNOSIS — H52223 Regular astigmatism, bilateral: Secondary | ICD-10-CM | POA: Diagnosis not present

## 2015-08-27 DIAGNOSIS — H5213 Myopia, bilateral: Secondary | ICD-10-CM | POA: Diagnosis not present

## 2015-09-10 DIAGNOSIS — F4310 Post-traumatic stress disorder, unspecified: Secondary | ICD-10-CM | POA: Diagnosis not present

## 2015-09-16 DIAGNOSIS — F4310 Post-traumatic stress disorder, unspecified: Secondary | ICD-10-CM | POA: Diagnosis not present

## 2015-09-21 DIAGNOSIS — F3132 Bipolar disorder, current episode depressed, moderate: Secondary | ICD-10-CM | POA: Diagnosis not present

## 2015-09-25 DIAGNOSIS — F4310 Post-traumatic stress disorder, unspecified: Secondary | ICD-10-CM | POA: Diagnosis not present

## 2015-10-02 DIAGNOSIS — F4310 Post-traumatic stress disorder, unspecified: Secondary | ICD-10-CM | POA: Diagnosis not present

## 2015-10-23 DIAGNOSIS — F4310 Post-traumatic stress disorder, unspecified: Secondary | ICD-10-CM | POA: Diagnosis not present

## 2015-11-02 DIAGNOSIS — F3132 Bipolar disorder, current episode depressed, moderate: Secondary | ICD-10-CM | POA: Diagnosis not present

## 2015-11-05 DIAGNOSIS — F4310 Post-traumatic stress disorder, unspecified: Secondary | ICD-10-CM | POA: Diagnosis not present

## 2015-11-16 DIAGNOSIS — F3132 Bipolar disorder, current episode depressed, moderate: Secondary | ICD-10-CM | POA: Diagnosis not present

## 2015-12-01 DIAGNOSIS — F4310 Post-traumatic stress disorder, unspecified: Secondary | ICD-10-CM | POA: Diagnosis not present

## 2015-12-09 DIAGNOSIS — F4310 Post-traumatic stress disorder, unspecified: Secondary | ICD-10-CM | POA: Diagnosis not present

## 2015-12-24 DIAGNOSIS — F4310 Post-traumatic stress disorder, unspecified: Secondary | ICD-10-CM | POA: Diagnosis not present

## 2015-12-28 DIAGNOSIS — F3132 Bipolar disorder, current episode depressed, moderate: Secondary | ICD-10-CM | POA: Diagnosis not present

## 2016-01-12 DIAGNOSIS — F4310 Post-traumatic stress disorder, unspecified: Secondary | ICD-10-CM | POA: Diagnosis not present

## 2016-01-28 DIAGNOSIS — F4310 Post-traumatic stress disorder, unspecified: Secondary | ICD-10-CM | POA: Diagnosis not present

## 2016-03-08 DIAGNOSIS — F4310 Post-traumatic stress disorder, unspecified: Secondary | ICD-10-CM | POA: Diagnosis not present

## 2016-04-01 DIAGNOSIS — F4310 Post-traumatic stress disorder, unspecified: Secondary | ICD-10-CM | POA: Diagnosis not present

## 2016-04-21 DIAGNOSIS — F4310 Post-traumatic stress disorder, unspecified: Secondary | ICD-10-CM | POA: Diagnosis not present

## 2016-04-25 DIAGNOSIS — Z202 Contact with and (suspected) exposure to infections with a predominantly sexual mode of transmission: Secondary | ICD-10-CM | POA: Diagnosis not present

## 2016-05-05 ENCOUNTER — Ambulatory Visit (INDEPENDENT_AMBULATORY_CARE_PROVIDER_SITE_OTHER): Payer: BLUE CROSS/BLUE SHIELD | Admitting: Medical

## 2016-05-05 ENCOUNTER — Telehealth: Payer: Self-pay | Admitting: Internal Medicine

## 2016-05-05 ENCOUNTER — Encounter: Payer: Self-pay | Admitting: Medical

## 2016-05-05 VITALS — BP 200/110 | HR 110 | Temp 98.4°F | Wt 262.6 lb

## 2016-05-05 DIAGNOSIS — R21 Rash and other nonspecific skin eruption: Secondary | ICD-10-CM | POA: Diagnosis not present

## 2016-05-05 DIAGNOSIS — I10 Essential (primary) hypertension: Secondary | ICD-10-CM

## 2016-05-05 DIAGNOSIS — B49 Unspecified mycosis: Secondary | ICD-10-CM | POA: Diagnosis not present

## 2016-05-05 LAB — COMPLETE METABOLIC PANEL WITH GFR
ALK PHOS: 66 U/L (ref 40–115)
ALT: 24 U/L (ref 9–46)
AST: 22 U/L (ref 10–40)
Albumin: 4.5 g/dL (ref 3.6–5.1)
BUN: 18 mg/dL (ref 7–25)
CALCIUM: 9 mg/dL (ref 8.6–10.3)
CO2: 21 mmol/L (ref 20–31)
Chloride: 104 mmol/L (ref 98–110)
Creat: 0.73 mg/dL (ref 0.60–1.35)
GFR, Est African American: >89 mL/min (ref >=60)
GLUCOSE: 89 mg/dL (ref 65–99)
POTASSIUM: 3.6 mmol/L (ref 3.5–5.3)
SODIUM: 139 mmol/L (ref 135–146)
Total Bilirubin: 0.4 mg/dL (ref 0.2–1.2)
Total Protein: 7.4 g/dL (ref 6.1–8.1)

## 2016-05-05 LAB — CBC WITH DIFFERENTIAL/PLATELET
Basophils Absolute: 0 cells/uL (ref 0–200)
Basophils Relative: 0 %
EOS PCT: 1 %
Eosinophils Absolute: 99 cells/uL (ref 15–500)
HCT: 44.9 % (ref 38.5–50.0)
HEMOGLOBIN: 15.1 g/dL (ref 13.2–17.1)
LYMPHS ABS: 2079 {cells}/uL (ref 850–3900)
Lymphocytes Relative: 21 %
MCH: 30.1 pg (ref 27.0–33.0)
MCHC: 33.6 g/dL (ref 32.0–36.0)
MCV: 89.6 fL (ref 80.0–100.0)
MPV: 10.4 fL (ref 7.5–12.5)
Monocytes Absolute: 495 cells/uL (ref 200–950)
Monocytes Relative: 5 %
Neutro Abs: 7227 cells/uL (ref 1500–7800)
Neutrophils Relative %: 73 %
Platelets: 227 10*3/uL (ref 140–400)
RBC: 5.01 MIL/uL (ref 4.20–5.80)
RDW: 14 % (ref 11.0–15.0)
WBC: 9.9 10*3/uL (ref 3.8–10.8)

## 2016-05-05 MED ORDER — NYSTATIN-TRIAMCINOLONE 100000-0.1 UNIT/GM-% EX CREA: 1.0000 "application " | TOPICAL_CREAM | Freq: Two times a day (BID) | CUTANEOUS | 0 refills | Status: DC

## 2016-05-05 MED ORDER — LISINOPRIL-HYDROCHLOROTHIAZIDE 20-25 MG PO TABS: ORAL_TABLET | ORAL | 3 refills | Status: DC

## 2016-05-05 NOTE — Telephone Encounter (Signed)
Caller name: Relationship to patient:  Can be reached: [facility] [address] [facility] [place], [place] [phone] (Phone) [phone] (Fax)     Reason for call: Please  [facility]. [facility] deleted [facility] by accident

## 2016-05-05 NOTE — Progress Notes (Signed)
Subjective:    Patient ID: Mathew Daniels, male    DOB: 1980/08/04, 36 y.o.   MRN: [id number]  HPI  Pt in with rash around his groin area.  Pt states itching severely since December.  He got concerned for std and he had various test done by UC about 2 months ago.  Pt had g and c, hcv, hsv, hiv and rpr. All negative. He has no pain urinating. No DC.  Pt not tried any meds.  At urgent care 188/120 when labs done. No treatment given per pt.  On review pt has no cardiac signs or neurologic signs or symptoms. Pb elevated when I checked.   Review of Systems  Respiratory: Negative for cough, chest tightness, shortness of breath and wheezing.   Cardiovascular: Negative for chest pain and palpitations.  Gastrointestinal: Negative for abdominal pain.  Musculoskeletal: Negative for back pain.  Skin: Negative for rash.       Itching to scrotum.  Neurological: Negative for dizziness, seizures, facial asymmetry, speech difficulty, weakness, light-headedness and headaches.  Hematological: Negative for adenopathy. Does not bruise/bleed easily.  Psychiatric/Behavioral: Negative for agitation, confusion, dysphoric mood, sleep disturbance and suicidal ideas. The patient is not nervous/anxious.    Past Medical History:  Diagnosis Date  . ADD   . Bronchospasm   . Environmental allergies   . H/O: depression   . HTN      Social History   Social History  . Marital status: Single    Spouse name: N/A  . Number of children: 1  . Years of education: N/A   Occupational History  . finances     Social History Main Topics  . Smoking status: Former Games developer  . Smokeless tobacco: Former Neurosurgeon     Comment: quit smoking 2012, quit chewing 2014  . Alcohol use Yes     Comment: socially  . Drug use: No  . Sexual activity: Not on file   Other Topics Concern  . Not on file   Social History Narrative   Married, 1 daughter born 2012 , 2 step kids                   Past Surgical  History:  Procedure Laterality Date  . TENDON REPAIR Right 1998   ankle    Family History  Problem Relation Age of Onset  . Colon cancer Neg Hx   . Cancer - Prostate Neg Hx   . CAD Father 31    MI x 2 CABG  . CAD Other     GF MI  . Diabetes Father   . Stroke Father     Allergies  Allergen Reactions  . Amoxicillin     Blisters feet hand July 2013, took amoxicillin before w/o problems  . Carvedilol     REACTION: NAUSEA    Current Outpatient Prescriptions on File Prior to Visit  Medication Sig Dispense Refill  . Multiple Vitamin (MULTIVITAMIN) tablet Take 1 tablet by mouth daily. Optimen    . Kelp TABS Take 1 each by mouth daily.     No current facility-administered medications on file prior to visit.     BP (!) 221/132   Pulse (!) 110   Temp 98.4 F (36.9 C) (Oral)   Wt 262 lb 9.6 oz (119.1 kg)   SpO2 97%   BMI 39.93 kg/m       Objective:   Physical Exam  General Mental Status- Alert. General Appearance- Not in acute distress.  Skin General: Color- Normal Color. Moisture- Normal Moisture.  Neck Carotid Arteries- Normal color. Moisture- Normal Moisture. No carotid bruits. No JVD.  Chest and Lung Exam Auscultation: Breath Sounds:-Normal.  Cardiovascular Auscultation:Rythm- Regular. Murmurs & Other Heart Sounds:Auscultation of the heart reveals- No Murmurs.  Genital exam- normal but scrotum bottom aspect mild red faint inflamed appearance.   Neurologic Cranial Nerve exam:- CN III-XII intact(No nystagmus), symmetric smile. Strength:- 5/5 equal and symmetric strength both upper and lower extremities.      Assessment & Plan:  For your very high blood pressure start bp med tonight.  We got labs tonight. cmp and cbc.   Get otc cuff. Check twice a day. If bp not less than 160/90 in 3 days then take whole tab. If any cardiac or neurologic signs or symptoms then ED  eval.  For your groin rash mycolog cream to apply twice daily  Follow up on  Monday. 1-2 in afternoon.  On follow up will get ekg. Also if no improvement in bp then will refer pt to cardiologist for evaluation/work up of cause of such high bp. I did discuss choice of medication with Dr Patsy Lageropland today.  Latonia Conrow, Ramon DredgeEdward, PA-C

## 2016-05-05 NOTE — Patient Instructions (Signed)
For your very high blood pressure start bp med tonight.  We got labs tonight. cmp and cbc.   Get otc cuff. Check twice a day. If bp not less than 160/90 in 3 days then take whole tab. If any cardiac or neurologic signs or symptoms then ED  eval.  For your groin rash mycolog cream to apply twice daily  Follow up on Monday. 1-2 in afternoon.

## 2016-05-05 NOTE — Progress Notes (Signed)
Pre visit review using our clinic tool,if applicable. No additional management support is needed unless otherwise documented below in the visit note.  

## 2016-05-06 NOTE — Telephone Encounter (Signed)
Complete.  PC

## 2016-05-09 ENCOUNTER — Telehealth: Payer: Self-pay

## 2016-05-09 ENCOUNTER — Ambulatory Visit (INDEPENDENT_AMBULATORY_CARE_PROVIDER_SITE_OTHER): Payer: BLUE CROSS/BLUE SHIELD | Admitting: Medical

## 2016-05-09 ENCOUNTER — Encounter: Payer: Self-pay | Admitting: Internal Medicine

## 2016-05-09 ENCOUNTER — Encounter: Payer: Self-pay | Admitting: Medical

## 2016-05-09 VITALS — BP 180/100 | HR 105 | Temp 98.2°F | Resp 16 | Ht 68.0 in | Wt 267.4 lb

## 2016-05-09 DIAGNOSIS — I10 Essential (primary) hypertension: Secondary | ICD-10-CM | POA: Diagnosis not present

## 2016-05-09 DIAGNOSIS — R21 Rash and other nonspecific skin eruption: Secondary | ICD-10-CM | POA: Diagnosis not present

## 2016-05-09 MED ORDER — AMLODIPINE BESYLATE 10 MG PO TABS: 10.0000 mg | ORAL_TABLET | Freq: Every day | ORAL | 0 refills | Status: DC

## 2016-05-09 NOTE — Telephone Encounter (Signed)
Received note from [facility] regarding patient having Critical labs. Patient advised by [facility] to schedule appointment with PCP. Patient scheduled to Esperanza RichtersEdward Saguier, PA-C today at 2:00 pm. Advised patient that if he felt bad or developed any new symptoms before appointment, to go straight to ED or UC. patient agreed.

## 2016-05-09 NOTE — Patient Instructions (Addendum)
Your blood pressure is improved compared to the other day but not much. You are now on th full tab of lisinopril/hctz. Check bp daily and document readings. Update me tomorrow after noon by my chart or call office. If you bp is not less than 160/90 then go ahead and add amlodipine 10 mg a day.   If you do need to start amlodipine and by early next early next weeks not reaching close to 140/90 may go ahead and order urine metanephrine and renal artery [place].  ekg showed normal sinus rhythm today.  Continue the nysatin cream.  Continue to advise if cardiac or neurologic signs or symptoms occur with these high bp readings then ED evaluation.  Follow up in one week or as needed.  DASH Eating Plan DASH stands for "Dietary Approaches to Stop Hypertension." The DASH eating plan is a healthy eating plan that has been shown to reduce high blood pressure (hypertension). Additional health benefits may include reducing the risk of type 2 diabetes mellitus, heart disease, and stroke. The DASH eating plan may also help with weight loss. What do I need to know about the DASH eating plan? For the DASH eating plan, you will follow these general guidelines:  Choose foods with less than 150 milligrams of sodium per serving (as listed on the food label).  Use salt-free seasonings or herbs instead of table salt or sea salt.  Check with your health care provider or pharmacist before using salt substitutes.  Eat lower-sodium products. These are often labeled as "low-sodium" or "no salt added."  Eat fresh foods. Avoid eating a lot of canned foods.  Eat more vegetables, fruits, and low-fat dairy products.  Choose whole grains. Look for the word "whole" as the first word in the ingredient list.  Choose fish and skinless chicken or [place] more often than red meat. Limit fish, poultry, and meat to 6 oz (170 g) each day.  Limit sweets, desserts, sugars, and sugary drinks.  Choose heart-healthy fats.  Eat more  home-cooked food and less restaurant, buffet, and fast food.  Limit fried foods.  Do not fry foods. Cook foods using methods such as baking, boiling, grilling, and broiling instead.  When eating at a restaurant, ask that your food be prepared with less salt, or no salt if possible. What foods can I eat? Seek help from a dietitian for individual calorie needs. Grains  Whole grain or whole wheat bread. Brown rice. Whole grain or whole wheat pasta. Quinoa, bulgur, and whole grain cereals. Low-sodium cereals. Corn or whole wheat flour tortillas. Whole grain cornbread. Whole grain crackers. Low-sodium crackers. Vegetables  Fresh or frozen vegetables (raw, steamed, roasted, or grilled). Low-sodium or reduced-sodium tomato and vegetable juices. Low-sodium or reduced-sodium tomato sauce and paste. Low-sodium or reduced-sodium canned vegetables. Fruits  All fresh, canned (in natural juice), or frozen fruits. Meat and Other Protein Products  Ground beef (85% or leaner), grass-fed beef, or beef trimmed of fat. Skinless chicken or [place]. Ground chicken or [place]. Pork trimmed of fat. All fish and seafood. Eggs. Dried beans, peas, or lentils. Unsalted nuts and seeds. Unsalted canned beans. Dairy  Low-fat dairy products, such as skim or 1% milk, 2% or reduced-fat cheeses, low-fat ricotta or cottage cheese, or plain low-fat yogurt. Low-sodium or reduced-sodium cheeses. Fats and Oils  Tub margarines without trans fats. Light or reduced-fat mayonnaise and salad dressings (reduced sodium). Avocado. Safflower, olive, or canola oils. Natural peanut or almond butter. Other  Unsalted popcorn and pretzels. The  items listed above may not be a complete list of recommended foods or beverages. Contact your dietitian for more options.  What foods are not recommended? Grains  White bread. White pasta. White rice. Refined cornbread. Bagels and croissants. Crackers that contain trans fat. Vegetables  Creamed or fried  vegetables. Vegetables in a cheese sauce. Regular canned vegetables. Regular canned tomato sauce and paste. Regular tomato and vegetable juices. Fruits  Canned fruit in light or heavy syrup. Fruit juice. Meat and Other Protein Products  Fatty cuts of meat. Ribs, chicken wings, bacon, sausage, bologna, salami, chitterlings, fatback, hot dogs, bratwurst, and packaged luncheon meats. Salted nuts and seeds. Canned beans with salt. Dairy  Whole or 2% milk, cream, half-and-half, and cream cheese. Whole-fat or sweetened yogurt. Full-fat cheeses or blue cheese. Nondairy creamers and whipped toppings. Processed cheese, cheese spreads, or cheese curds. Condiments  Onion and garlic salt, seasoned salt, table salt, and sea salt. Canned and packaged gravies. Worcestershire sauce. Tartar sauce. Barbecue sauce. Teriyaki sauce. Soy sauce, including reduced sodium. Steak sauce. Fish sauce. Oyster sauce. Cocktail sauce. Horseradish. Ketchup and mustard. Meat flavorings and tenderizers. Bouillon cubes. Hot sauce. Tabasco sauce. Marinades. Taco seasonings. Relishes. Fats and Oils  Butter, stick margarine, lard, shortening, ghee, and bacon fat. Coconut, palm kernel, or palm oils. Regular salad dressings. Other  Pickles and olives. Salted popcorn and pretzels. The items listed above may not be a complete list of foods and beverages to avoid. Contact your dietitian for more information.  Where can I find more information? National Heart, Lung, and Blood Institute: CablePromo.itwww.nhlbi.nih.gov/health/health-topics/topics/dash/ This information is not intended to replace advice given to you by your health care provider. Make sure you discuss any questions you have with your health care provider. Document Released: 03/03/2011 Document Revised: 08/20/2015 Document Reviewed: 01/16/2013 Elsevier Interactive Patient Education  2017 [employer]

## 2016-05-09 NOTE — Progress Notes (Signed)
Subjective:    Patient ID: Mathew Daniels, male    DOB: 08/15/1980, 36 y.o.   MRN: [id number]  HPI   Pt in for follow up on bp and his scrotum rash.  Rash/scrotum  is feeling a lot better. A lot less itching.  Pt also had high bp on his last visit. Pt dad had high blood pressure in past. Pt not sure if dad had resistant high blood pressure. Pt dad did have 6 bypasses.   Pt has no cardiac or neurologic  signs or symptoms. Pt was taking just 1/2 tablet of the lisinopril/hctz a day. Today he started taking full tablet. He just got arm cuff today.  Pt cut back on caffeine already.   Pt work can be stressful.   Pt bp on review 2010 bp 192/112. Back then his weight was much higher.   Review of Systems  Constitutional: Negative for chills, fatigue and fever.  Respiratory: Negative for cough, chest tightness, shortness of breath and wheezing.   Cardiovascular: Negative for chest pain and palpitations.  Gastrointestinal: Negative for abdominal pain, constipation, nausea and vomiting.  Musculoskeletal: Negative for back pain, joint swelling and neck stiffness.  Skin: Negative for rash.  Neurological: Negative for dizziness, weakness, numbness and headaches.  Hematological: Negative for adenopathy. Does not bruise/bleed easily.  Psychiatric/Behavioral: Negative for behavioral problems, confusion, dysphoric mood, sleep disturbance and suicidal ideas. The patient is not nervous/anxious.     Past Medical History:  Diagnosis Date  . ADD   . Bronchospasm   . Environmental allergies   . H/O: depression   . HTN      Social History   Social History  . Marital status: Single    Spouse name: N/A  . Number of children: 1  . Years of education: N/A   Occupational History  . finances     Social History Main Topics  . Smoking status: Former Games developermoker  . Smokeless tobacco: Former NeurosurgeonUser     Comment: quit smoking 2012, quit chewing 2014  . Alcohol use Yes     Comment: socially  . Drug  use: No  . Sexual activity: Not on file   Other Topics Concern  . Not on file   Social History Narrative   Married, 1 daughter born 2012 , 2 step kids                   Past Surgical History:  Procedure Laterality Date  . TENDON REPAIR Right 1998   ankle    Family History  Problem Relation Age of Onset  . Colon cancer Neg Hx   . Cancer - Prostate Neg Hx   . CAD Father 5540    MI x 2 CABG  . CAD Other     GF MI  . Diabetes Father   . Stroke Father     Allergies  Allergen Reactions  . Amoxicillin     Blisters feet hand July 2013, took amoxicillin before w/o problems  . Carvedilol     REACTION: NAUSEA    Current Outpatient Prescriptions on File Prior to Visit  Medication Sig Dispense Refill  . lisinopril-hydrochlorothiazide (PRINZIDE,ZESTORETIC) 20-25 MG tablet 1/2 tab po for 3 days. Advance to whole tab if bp not decreasing (Patient taking differently: Take 1 tablet by mouth daily. ) 30 tablet 3  . Multiple Vitamin (MULTIVITAMIN) tablet Take 1 tablet by mouth daily. Optimen    . nystatin-triamcinolone (MYCOLOG II) cream Apply 1 application topically 2 (two)  times daily. 30 g 0   No current facility-administered medications on file prior to visit.     BP (!) 0/0 (BP Location: Left Arm, Patient Position: Sitting, Cuff Size: Large)   Pulse (!) 105   Temp 98.2 F (36.8 C) (Oral)   Resp 16   Ht 5\' 8"  (1.727 m)   Wt 267 lb 6 oz (121.3 kg)   SpO2 97%   BMI 40.65 kg/m       Objective:   Physical Exam  General Mental Status- Alert. General Appearance- Not in acute distress.   Skin General: Color- Normal Color. Moisture- Normal Moisture.  Neck Carotid Arteries- Normal color. Moisture- Normal Moisture. No carotid bruits. No JVD.  Chest and Lung Exam Auscultation: Breath Sounds:-Normal.  Cardiovascular Auscultation:Rythm- Regular. Murmurs & Other Heart Sounds:Auscultation of the heart reveals- No Murmurs.   Neurologic Cranial Nerve exam:- CN  III-XII intact(No nystagmus), symmetric smile. Strength:- 5/5 equal and symmetric strength both upper and lower extremities.       Assessment & Plan:  Your blood pressure is improved compared to the other day but not much. You are now on th full tab of lisinopril/hctz. Check bp daily and document readings. Update me tomorrow after noon by my chart or call office. If you bp is not less than 160/90 then go ahead and add amlodipine 10 mg a day.   If you do need to start amlodipine and by early next early next weeks not reaching close to 140/90 may go ahead and order urine metanephrine and renal artery [place].  ekg showed normal sinus rhythm today.  Continue the nysatin cream.  Continue to advise if cardiac or neurologic signs or symptoms occur with these high bp readings then ED evaluation.  Follow up in one week or as needed.  Vivia Rosenburg, Ramon Dredge, PA-C

## 2016-05-09 NOTE — Progress Notes (Signed)
Pre visit review using our clinic review tool, if applicable. No additional management support is needed unless otherwise documented below in the visit note/SLS  

## 2016-05-10 ENCOUNTER — Telehealth: Payer: Self-pay | Admitting: Internal Medicine

## 2016-05-10 ENCOUNTER — Encounter: Payer: Self-pay | Admitting: Medical

## 2016-05-10 NOTE — Telephone Encounter (Signed)
[facility] - Client TELEPHONE ADVICE RECORD   [facility]     Patient Name: Mathew Daniels Initial Comment caller states his BP is higher than it was before taking some medication. 190/120. Caller says he's just feeling a little off was not sure if it was the medication of not .   DOB: 03/08/1981      Nurse Assessment  Nurse: Tera Materowan, RN, Elnita Maxwellheryl Date/Time (Eastern Time): 05/10/2016 4:45:48 PM  Confirm and document reason for call. If symptomatic, describe symptoms. ---Caller states that he was dx with HTN wk before last and placed on lisinopril. He was seen again this week and advised to start on amolodopine it remains elevated. Today it was 190/120 after taking the second med. Currently it has dropped but remains elevated. Denies any s/s.  Does the patient have any new or worsening symptoms? ---Yes  Will a triage be completed? ---Yes  Related visit to physician within the last 2 weeks? ---Yes  Does the PT have any chronic conditions? (i.e. diabetes, asthma, etc.) ---Yes  List chronic conditions. ---HTN  Is this a behavioral health or substance abuse call? ---No    Guidelines     Guideline Title Affirmed Question Affirmed Notes   High Blood Pressure BP ? 180/110    Final Disposition User   See Physician within 24 Hours Tera Materowan, RN, [employer]Cheryl     Referrals   REFERRED TO PCP OFFICE   Disagree

## 2016-05-10 NOTE — Telephone Encounter (Signed)
°  Relation to XB:JYNWpt:self Call back number:[phone] [facility] [address] - [place], [place] - [id number] [address] [facility] [address] [phone] (Phone) [phone] (Fax)      Reason for call:  Patient would like to discuss new medication amLODipine (NORVASC) 10 MG tablet due to BP being 190/120 patient transferred to [facility],please advise

## 2016-05-10 NOTE — Telephone Encounter (Signed)
Pt triaged to [facility].

## 2016-05-11 ENCOUNTER — Encounter: Payer: Self-pay | Admitting: Medical

## 2016-05-11 ENCOUNTER — Ambulatory Visit: Payer: Self-pay | Admitting: Medical

## 2016-05-11 ENCOUNTER — Ambulatory Visit (INDEPENDENT_AMBULATORY_CARE_PROVIDER_SITE_OTHER): Payer: BLUE CROSS/BLUE SHIELD | Admitting: Medical

## 2016-05-11 VITALS — BP 150/98 | HR 90 | Temp 98.5°F | Resp 16 | Ht 68.0 in | Wt 263.5 lb

## 2016-05-11 DIAGNOSIS — F419 Anxiety disorder, unspecified: Secondary | ICD-10-CM

## 2016-05-11 DIAGNOSIS — I10 Essential (primary) hypertension: Secondary | ICD-10-CM

## 2016-05-11 MED ORDER — CLONAZEPAM 0.5 MG PO TABS: 0.5000 mg | ORAL_TABLET | Freq: Two times a day (BID) | ORAL | 0 refills | Status: DC | PRN

## 2016-05-11 NOTE — Patient Instructions (Addendum)
Your blood pressure is relatively good today compared prior readings. Continue lisinopril/hctz and amlodipine. Try to not over recheck your bp(check 1-2 times a day when relaxed. But can check if symptomatic as well.)  For anxiety will rx klonopin but use sparingly as discussed for severe anxiety.  Follow up in 3 weeks or as needed  I think be may drop even further.

## 2016-05-11 NOTE — Progress Notes (Signed)
Subjective:    Patient ID: Mathew Daniels, male    DOB: 1980/07/09, 36 y.o.   MRN: [id number]  HPI  Pt in for follow up.  Pt bp is was high yesterday after taking amlodipine. Pt states he was feeling off yesterday but stressed at work. Pt drank a lot of water. Pt drinking only tea now but no other caffeine drinks.  He is not drinking any monster drinks. No coffee. Pt has cut back on his sodium intake.   Pt eating better now.   Yesterday when felt off no cardiac and no neurologic signs or symptos.   Review of Systems  Constitutional: Negative for chills, fatigue and fever.  Respiratory: Negative for cough, chest tightness, shortness of breath and wheezing.   Cardiovascular: Negative for chest pain and palpitations.  Gastrointestinal: Negative for abdominal pain.  Skin: Negative for rash.  Neurological: Negative for dizziness, syncope, speech difficulty, weakness and light-headedness.  Hematological: Negative for adenopathy. Does not bruise/bleed easily.  Psychiatric/Behavioral: Negative for behavioral problems, dysphoric mood and sleep disturbance. The patient is nervous/anxious.        Some occasional anxiety at times. Work/stress related yesterday and historically.    Past Medical History:  Diagnosis Date  . ADD   . Bronchospasm   . Environmental allergies   . H/O: depression   . HTN      Social History   Social History  . Marital status: Single    Spouse name: N/A  . Number of children: 1  . Years of education: N/A   Occupational History  . finances     Social History Main Topics  . Smoking status: Former Games developer  . Smokeless tobacco: Former Neurosurgeon     Comment: quit smoking 2012, quit chewing 2014  . Alcohol use Yes     Comment: socially  . Drug use: No  . Sexual activity: Not on file   Other Topics Concern  . Not on file   Social History Narrative   Married, 1 daughter born 2012 , 2 step kids                   Past Surgical History:  Procedure  Laterality Date  . TENDON REPAIR Right 1998   ankle    Family History  Problem Relation Age of Onset  . Colon cancer Neg Hx   . Cancer - Prostate Neg Hx   . CAD Father 37    MI x 2 CABG  . CAD Other     GF MI  . Diabetes Father   . Stroke Father     Allergies  Allergen Reactions  . Amoxicillin     Blisters feet hand July 2013, took amoxicillin before w/o problems  . Carvedilol     REACTION: NAUSEA    Current Outpatient Prescriptions on File Prior to Visit  Medication Sig Dispense Refill  . amLODipine (NORVASC) 10 MG tablet Take 1 tablet (10 mg total) by mouth daily. 30 tablet 0  . lisinopril-hydrochlorothiazide (PRINZIDE,ZESTORETIC) 20-25 MG tablet 1/2 tab po for 3 days. Advance to whole tab if bp not decreasing (Patient taking differently: Take 1 tablet by mouth daily. ) 30 tablet 3  . Multiple Vitamin (MULTIVITAMIN) tablet Take 1 tablet by mouth daily. Optimen    . nystatin-triamcinolone (MYCOLOG II) cream Apply 1 application topically 2 (two) times daily. 30 g 0   No current facility-administered medications on file prior to visit.     BP (!) 158/102 Comment: Left  Arm Manuually  Pulse 90   Temp 98.5 F (36.9 C) (Oral)   Resp 16   Ht 5\' 8"  (1.727 m)   Wt 263 lb 8 oz (119.5 kg)   SpO2 97%   BMI 40.07 kg/m       Objective:   Physical Exam  General Mental Status- Alert. General Appearance- Not in acute distress.   Skin General: Color- Normal Color. Moisture- Normal Moisture.  Neck Carotid Arteries- Normal color. Moisture- Normal Moisture. No carotid bruits. No JVD.  Chest and Lung Exam Auscultation: Breath Sounds:-Normal.  Cardiovascular Auscultation:Rythm- Regular. Murmurs & Other Heart Sounds:Auscultation of the heart reveals- No Murmurs.   Neurologic Cranial Nerve exam:- CN III-XII intact(No nystagmus), symmetric smile. Drift Test:- No drift. Finger to Nose:- Normal/Intact Strength:- 5/5 equal and symmetric strength both upper and lower  extremities.      Assessment & Plan:  Your blood pressure is relatively good today compared prior readings. Continue lisinopril/hctz and amlodipine. Try to not over recheck your bp. (check 1-2 time a day when relaxed. But can if symptomatic as well.)  For anxiety will rx klonopin but use sparingly as discussed for severe anxiety.  Follow up in 3 weeks or as needed  I think be may drop even further. Pt has appointment on Monday and keep if needed. But if bp well controlled and dropping even further  140/90  range then can cancel Monday appointment.

## 2016-05-11 NOTE — Telephone Encounter (Signed)
Pt being seen today by Ramon DredgeEdward.

## 2016-05-11 NOTE — Progress Notes (Signed)
venoiPre visit review using our clinic review tool, if applicable. No additional management support is needed unless otherwise documented below in the visit note/SLS

## 2016-05-12 DIAGNOSIS — F4310 Post-traumatic stress disorder, unspecified: Secondary | ICD-10-CM | POA: Diagnosis not present

## 2016-05-16 ENCOUNTER — Ambulatory Visit: Payer: BLUE CROSS/BLUE SHIELD | Admitting: Medical

## 2016-05-16 DIAGNOSIS — Z0289 Encounter for other administrative examinations: Secondary | ICD-10-CM

## 2016-05-23 ENCOUNTER — Encounter: Payer: Self-pay | Admitting: Internal Medicine

## 2016-05-26 DIAGNOSIS — F4310 Post-traumatic stress disorder, unspecified: Secondary | ICD-10-CM | POA: Diagnosis not present

## 2016-06-09 DIAGNOSIS — F4310 Post-traumatic stress disorder, unspecified: Secondary | ICD-10-CM | POA: Diagnosis not present

## 2016-06-14 ENCOUNTER — Other Ambulatory Visit: Payer: Self-pay | Admitting: Medical

## 2016-06-15 DIAGNOSIS — J069 Acute upper respiratory infection, unspecified: Secondary | ICD-10-CM | POA: Diagnosis not present

## 2016-06-15 DIAGNOSIS — R05 Cough: Secondary | ICD-10-CM | POA: Diagnosis not present

## 2016-06-15 MED ORDER — AMLODIPINE BESYLATE 10 MG PO TABS: 10.0000 mg | ORAL_TABLET | Freq: Every day | ORAL | 0 refills | Status: DC

## 2016-07-19 ENCOUNTER — Telehealth: Payer: Self-pay | Admitting: Internal Medicine

## 2016-07-19 NOTE — Telephone Encounter (Signed)
No charge. But ask him to or get MA or other to call and confirm what his bp readings are.

## 2016-07-19 NOTE — Telephone Encounter (Signed)
Pt called in he says that he received a bill for 05/16/16 for no show. Pt says that he was advised by provider at previous visit on 05/11/16 that if he felt okay and bp was okay he didn't need to come into 2/19 appt. Pt would like to know if his no show fee could be reversed?    Please advise.

## 2016-07-20 DIAGNOSIS — F4310 Post-traumatic stress disorder, unspecified: Secondary | ICD-10-CM | POA: Diagnosis not present

## 2016-07-25 ENCOUNTER — Encounter: Payer: Self-pay | Admitting: Medical

## 2016-08-01 ENCOUNTER — Ambulatory Visit (INDEPENDENT_AMBULATORY_CARE_PROVIDER_SITE_OTHER): Payer: BLUE CROSS/BLUE SHIELD | Admitting: Medical

## 2016-08-01 ENCOUNTER — Encounter: Payer: Self-pay | Admitting: Medical

## 2016-08-01 VITALS — BP 146/94 | HR 78 | Temp 97.8°F | Resp 16 | Ht 68.0 in | Wt 250.0 lb

## 2016-08-01 DIAGNOSIS — I10 Essential (primary) hypertension: Secondary | ICD-10-CM | POA: Diagnosis not present

## 2016-08-01 DIAGNOSIS — F17200 Nicotine dependence, unspecified, uncomplicated: Secondary | ICD-10-CM

## 2016-08-01 DIAGNOSIS — F419 Anxiety disorder, unspecified: Secondary | ICD-10-CM | POA: Diagnosis not present

## 2016-08-01 MED ORDER — CLONAZEPAM 0.5 MG PO TABS: 0.5000 mg | ORAL_TABLET | Freq: Two times a day (BID) | ORAL | 0 refills | Status: DC | PRN

## 2016-08-01 MED ORDER — AMLODIPINE BESYLATE 10 MG PO TABS: 10.0000 mg | ORAL_TABLET | Freq: Every day | ORAL | 3 refills | Status: DC

## 2016-08-01 MED ORDER — BUPROPION HCL ER (XL) 150 MG PO TB24: 150.0000 mg | ORAL_TABLET | Freq: Every day | ORAL | 1 refills | Status: DC

## 2016-08-01 NOTE — Progress Notes (Signed)
Subjective:    Patient ID: Mathew Daniels, male    DOB: 12/31/1980, 36 y.o.   MRN: [id number]  HPI   Pt in for follow up.   Pt has not been taking his blood pressure today. He had been on 2 bp meds after having severe high blood pressures 221/132, 200/100, 180/100, 162/107, 158/102, 150/98 and today 146/94. I had given pt medication to control his blood pressure. His blood pressure was extremely high and came down. He used medication for a while but then stopped. He states he stopped medication since mid march.   Pt last checked his bp reading and he states he got high 120's over high 80't late last week and this was without any medications.   Pt bp today 140/90 with no bp medications.  Pt states he weighed 242 past week. He states he ates a lot this weekend possbly high salt foods.  Pt has been working out a lot and lost a lot of weight.  Pt desires to quit smoking. He per chart had remote hx of depression. So discussed with him wellbutrin would be better option then chantix.   Pt also request refill of clonazepam. He uses for extreme stress at work.   Review of Systems  Constitutional: Negative for chills and fatigue.  HENT: Negative for congestion and facial swelling.   Respiratory: Negative for cough, chest tightness, wheezing and stridor.   Cardiovascular: Negative for chest pain and palpitations.  Gastrointestinal: Negative for abdominal pain, diarrhea and rectal pain.  Musculoskeletal: Negative for back pain.  Skin: Negative for rash.  Neurological: Negative for dizziness, tremors, seizures, speech difficulty, weakness, numbness and headaches.  Hematological: Negative for adenopathy. Does not bruise/bleed easily.  Psychiatric/Behavioral: Negative for behavioral problems, dysphoric mood and hallucinations. The patient is nervous/anxious.        Periodic anxiety related to work and sometimes travel.   Past Medical History:  Diagnosis Date  . ADD   . Bronchospasm     . Environmental allergies   . H/O: depression   . HTN      Social History   Social History  . Marital status: Single    Spouse name: N/A  . Number of children: 1  . Years of education: N/A   Occupational History  . finances     Social History Main Topics  . Smoking status: Former Games developermoker  . Smokeless tobacco: Former NeurosurgeonUser     Comment: quit smoking 2012, quit chewing 2014  . Alcohol use Yes     Comment: socially  . Drug use: No  . Sexual activity: Not on file   Other Topics Concern  . Not on file   Social History Narrative   Married, 1 daughter born 2012 , 2 step kids                   Past Surgical History:  Procedure Laterality Date  . TENDON REPAIR Right 1998   ankle    Family History  Problem Relation Age of Onset  . Colon cancer Neg Hx   . Cancer - Prostate Neg Hx   . CAD Father [id number]    MI x 2 CABG  . CAD Other     GF MI  . Diabetes Father   . Stroke Father     Allergies  Allergen Reactions  . Amoxicillin     Blisters feet hand July 2013, took amoxicillin before w/o problems  . Carvedilol     REACTION: NAUSEA  Current Outpatient Prescriptions on File Prior to Visit  Medication Sig Dispense Refill  . clonazePAM (KLONOPIN) 0.5 MG tablet Take 1 tablet (0.5 mg total) by mouth 2 (two) times daily as needed for anxiety. 5 tablet 0  . Multiple Vitamin (MULTIVITAMIN) tablet Take 1 tablet by mouth daily. Optimen    . nystatin-triamcinolone (MYCOLOG II) cream Apply 1 application topically 2 (two) times daily. 30 g 0  . amLODipine (NORVASC) 10 MG tablet Take 1 tablet (10 mg total) by mouth daily. (Patient not taking: Reported on 08/01/2016) 30 tablet 0  . lisinopril-hydrochlorothiazide (PRINZIDE,ZESTORETIC) 20-25 MG tablet 1/2 tab po for 3 days. Advance to whole tab if bp not decreasing (Patient not taking: Reported on 08/01/2016) 30 tablet 3   No current facility-administered medications on file prior to visit.     BP (!) 146/94 (BP Location: Right Arm,  Patient Position: Sitting, Cuff Size: Normal)   Pulse 78   Temp 97.8 F (36.6 C) (Oral)   Resp 16   Ht 5\' 8"  (1.727 m)   Wt 250 lb (113.4 kg)   SpO2 94%   BMI 38.01 kg/m       Objective:   Physical Exam   General Mental Status- Alert. General Appearance- Not in acute distress.   Skin General: Color- Normal Color. Moisture- Normal Moisture.  Neck Carotid Arteries- Normal color. Moisture- Normal Moisture. No carotid bruits. No JVD.  Chest and Lung Exam Auscultation: Breath Sounds:-Normal.  Cardiovascular Auscultation:Rythm- Regular. Murmurs & Other Heart Sounds:Auscultation of the heart reveals- No Murmurs.  Abdomen Inspection:-Inspeection Normal. Palpation/Percussion:Note:No mass. Palpation and Percussion of the abdomen reveal- Non Tender, Non Distended + BS, no rebound or guarding.   Neurologic Cranial Nerve exam:- CN III-XII intact(No nystagmus), symmetric smile. Strength:- 5/5 equal and symmetric strength both upper and lower extremities.      Assessment & Plan:  Your blood pressure is much better than it was and you have not been on medication. Your weight loss appears to have had a positive impact on lowering your bp. But I do want you on at least one class of bp med. Will rx the amlodipine 10 mg tab to take 1 tab a day. Do want you to check your bp 2-3 times a week and write readings down. Return in 2-3 wks for visit. Need to confirm you bp is well controlled.  For smoking cessation rx wellbutrin. In 2 weeks after starting wellbutrin recommend tapering off cigarettes.  For anxiety/severe stress will write addition 12 tabs of clonazepam to continue strategic occasional use for severe anxiety.  Follow up in 2-3 weeks or as needed  Syrianna Schillaci, Ramon Dredge, [employer]

## 2016-08-01 NOTE — Progress Notes (Signed)
Pre visit review using our clinic review tool, if applicable. No additional management support is needed unless otherwise documented below in the visit note. 

## 2016-08-01 NOTE — Patient Instructions (Addendum)
Your blood pressure is much better than it was and you have not been on medication. Your weight loss appears to have had a positive impact on lowering your bp. But I do want you on at least one class of bp med.. Will rx the amlodipine 10 mg tab to take 1 tab a day. Do want you to check your bp 2-3 times a week and write readings down. Return in 2-3 wks for visit. Need to confirm you bp is well controlled.  For smoking cessation rx wellbutrin. In 2 weeks after starting wellbutrin recommend tapering off cigarettes.  For anxiety/severe stress will write addition 12 tabs of clonazepam to continue strategic occasional use for severe anxiety.  Follow up in 2-3 weeks or as needed

## 2016-08-03 DIAGNOSIS — Z713 Dietary counseling and surveillance: Secondary | ICD-10-CM | POA: Diagnosis not present

## 2016-08-06 DIAGNOSIS — F4310 Post-traumatic stress disorder, unspecified: Secondary | ICD-10-CM | POA: Diagnosis not present

## 2016-08-17 DIAGNOSIS — F4310 Post-traumatic stress disorder, unspecified: Secondary | ICD-10-CM | POA: Diagnosis not present

## 2016-08-23 ENCOUNTER — Ambulatory Visit (INDEPENDENT_AMBULATORY_CARE_PROVIDER_SITE_OTHER): Payer: BLUE CROSS/BLUE SHIELD | Admitting: Medical

## 2016-08-23 ENCOUNTER — Encounter: Payer: Self-pay | Admitting: Medical

## 2016-08-23 VITALS — BP 130/90 | HR 83 | Temp 98.3°F | Resp 16 | Ht 68.0 in | Wt 250.6 lb

## 2016-08-23 DIAGNOSIS — I10 Essential (primary) hypertension: Secondary | ICD-10-CM

## 2016-08-23 DIAGNOSIS — F419 Anxiety disorder, unspecified: Secondary | ICD-10-CM

## 2016-08-23 DIAGNOSIS — F17200 Nicotine dependence, unspecified, uncomplicated: Secondary | ICD-10-CM | POA: Diagnosis not present

## 2016-08-23 MED ORDER — CLONAZEPAM 0.5 MG PO TABS: 0.5000 mg | ORAL_TABLET | Freq: Two times a day (BID) | ORAL | 0 refills | Status: DC | PRN
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
## 2016-08-23 NOTE — Patient Instructions (Addendum)
Your bp is good today but bottom number little high. If your bottom number trend close to 90 then may be good idea to add low dose diuretic.  For smoking cessation continue wellbutrin. Gradually try to taper off of cigarrettes.  For anxiety refilling you medication. I gave you low number tabs and you would be out by now. You report loosing paper rx. Please look for that rx. If you are unable to find then can fill today current rx. Please let me know if able to find last rx. Controlled med contract reviewed and signs today.  Follow up in 3-4 weeks pcp or as needed

## 2016-09-13 DIAGNOSIS — F4310 Post-traumatic stress disorder, unspecified: Secondary | ICD-10-CM | POA: Diagnosis not present

## 2016-09-15 DIAGNOSIS — H5213 Myopia, bilateral: Secondary | ICD-10-CM | POA: Diagnosis not present

## 2016-09-15 DIAGNOSIS — H52223 Regular astigmatism, bilateral: Secondary | ICD-10-CM | POA: Diagnosis not present

## 2016-09-26 DIAGNOSIS — Z713 Dietary counseling and surveillance: Secondary | ICD-10-CM | POA: Diagnosis not present

## 2016-10-07 DIAGNOSIS — F4310 Post-traumatic stress disorder, unspecified: Secondary | ICD-10-CM | POA: Diagnosis not present

## 2016-10-27 DIAGNOSIS — F4310 Post-traumatic stress disorder, unspecified: Secondary | ICD-10-CM | POA: Diagnosis not present

## 2016-11-06 DIAGNOSIS — J029 Acute pharyngitis, unspecified: Secondary | ICD-10-CM | POA: Diagnosis not present

## 2016-11-06 DIAGNOSIS — J020 Streptococcal pharyngitis: Secondary | ICD-10-CM | POA: Diagnosis not present

## 2016-11-06 DIAGNOSIS — I10 Essential (primary) hypertension: Secondary | ICD-10-CM | POA: Diagnosis not present

## 2016-11-07 DIAGNOSIS — Z713 Dietary counseling and surveillance: Secondary | ICD-10-CM | POA: Diagnosis not present

## 2016-11-22 DIAGNOSIS — F4310 Post-traumatic stress disorder, unspecified: Secondary | ICD-10-CM | POA: Diagnosis not present

## 2016-12-02 DIAGNOSIS — F4310 Post-traumatic stress disorder, unspecified: Secondary | ICD-10-CM | POA: Diagnosis not present

## 2016-12-16 DIAGNOSIS — F4310 Post-traumatic stress disorder, unspecified: Secondary | ICD-10-CM | POA: Diagnosis not present

## 2016-12-19 DIAGNOSIS — Z713 Dietary counseling and surveillance: Secondary | ICD-10-CM | POA: Diagnosis not present

## 2017-01-05 DIAGNOSIS — F4310 Post-traumatic stress disorder, unspecified: Secondary | ICD-10-CM | POA: Diagnosis not present

## 2017-01-23 DIAGNOSIS — Z713 Dietary counseling and surveillance: Secondary | ICD-10-CM | POA: Diagnosis not present

## 2017-02-03 ENCOUNTER — Ambulatory Visit (INDEPENDENT_AMBULATORY_CARE_PROVIDER_SITE_OTHER): Payer: BLUE CROSS/BLUE SHIELD | Admitting: Internal Medicine

## 2017-02-03 ENCOUNTER — Encounter: Payer: Self-pay | Admitting: Internal Medicine

## 2017-02-03 VITALS — BP 164/98 | HR 109 | Temp 98.3°F | Resp 14 | Ht 68.0 in | Wt 250.5 lb

## 2017-02-03 DIAGNOSIS — I10 Essential (primary) hypertension: Secondary | ICD-10-CM | POA: Diagnosis not present

## 2017-02-03 DIAGNOSIS — F419 Anxiety disorder, unspecified: Secondary | ICD-10-CM | POA: Diagnosis not present

## 2017-02-03 DIAGNOSIS — Z113 Encounter for screening for infections with a predominantly sexual mode of transmission: Secondary | ICD-10-CM | POA: Diagnosis not present

## 2017-02-03 MED ORDER — CLONAZEPAM 0.5 MG PO TABS: 0.5000 mg | ORAL_TABLET | Freq: Two times a day (BID) | ORAL | 0 refills | Status: DC | PRN

## 2017-02-03 NOTE — Progress Notes (Signed)
Subjective:    Patient ID: Mathew Daniels, male    DOB: 02/22/1981, 36 y.o.   MRN: [id number]  DOS:  02/03/2017 Type of visit - description : acute Interval history: The patient requests STD checking. Denies being sexually active lately, but simply likes "to be responsible"  as he put it. Notices BP to be slightly elevated, taking amlodipine inconsistently, reports that for the last few weeks he has been very stressed at work, not eating right.  Review of Systems  Denies dysuria, gross hematuria, difficulty urinating. No GU or rash No penile discharge  Past Medical History:  Diagnosis Date  . ADD   . Bronchospasm   . Environmental allergies   . H/O: depression   . HTN     Past Surgical History:  Procedure Laterality Date  . TENDON REPAIR Right 1998   ankle    Social History   Socioeconomic History  . Marital status: Single    Spouse name: Not on file  . Number of children: 1  . Years of education: Not on file  . Highest education level: Not on file  Social Needs  . Financial resource strain: Not on file  . Food insecurity - worry: Not on file  . Food insecurity - inability: Not on file  . Transportation needs - medical: Not on file  . Transportation needs - non-medical: Not on file  Occupational History  . Occupation: finances   Tobacco Use  . Smoking status: Former Games developermoker  . Smokeless tobacco: Former NeurosurgeonUser  . Tobacco comment: quit smoking 2012, quit chewing 2014  Substance and Sexual Activity  . Alcohol use: Yes    Comment: socially  . Drug use: No  . Sexual activity: Not on file  Other Topics Concern  . Not on file  Social History Narrative   Married, 1 daughter born 2012 , 2 step kids                     Allergies as of 02/03/2017      Reactions   Amoxicillin    Blisters feet hand July 2013, took amoxicillin before w/o problems   Carvedilol    REACTION: NAUSEA   Penicillins Other (See Comments)   Blisters feet hand July 2013, took  amoxicillin before w/o problems Blisters feet hand July 2013, took amoxicillin before w/o problems Peeling on feet      Medication List        Accurate as of 02/03/17 11:59 PM. Always use your most recent med list.          amLODipine 10 MG tablet Commonly known as:  NORVASC Take 1 tablet (10 mg total) by mouth daily.   clonazePAM 0.5 MG tablet Commonly known as:  KLONOPIN Take 1 tablet (0.5 mg total) 2 (two) times daily as needed by mouth for anxiety.   multivitamin tablet Take 1 tablet by mouth daily. Optimen   nystatin-triamcinolone cream Commonly known as:  MYCOLOG II Apply 1 application topically 2 (two) times daily.          Objective:   Physical Exam BP (!) 164/98 (BP Location: Left Arm, Patient Position: Sitting, Cuff Size: Normal)   Pulse (!) 109   Temp 98.3 F (36.8 C) (Oral)   Resp 14   Ht 5\' 8"  (1.727 m)   Wt 250 lb 8 oz (113.6 kg)   SpO2 98%   BMI 38.09 kg/m  General:   Well developed, well nourished . NAD.  HEENT:  Normocephalic . Face symmetric, atraumatic  Neurologic:  alert & oriented X3.  Speech normal, gait appropriate for age and unassisted Psych--  Cognition and judgment appear intact.  Cooperative with normal attention span and concentration.  Behavior appropriate. No anxious or depressed appearing.      Assessment & Plan:   Assessment  HTN Anxiety ADHD Tobacco   PLAN: STD screening: Very low risk per history, asx; check a HIV, RPR, G&C. HTN: BP is slightly elevated, reports is taking amlodipine inconsistently, sometimes only 3 times a week.  Rec good compliance, importance of keeping BP well controlled to prevent problems and need to monitor BPs discussed;  DASH diet info provided. Anxiety: Has been quite stressed lately, work-related, refill clonazepam which he takes infrequently. RTC 3 months, CPX

## 2017-02-03 NOTE — Progress Notes (Signed)
Pre visit review using our clinic review tool, if applicable. No additional management support is needed unless otherwise documented below in the visit note. 

## 2017-02-03 NOTE — Patient Instructions (Signed)
GO TO THE LAB : Get the blood work     GO TO THE FRONT DESK Schedule your next appointment for a physical exam  In 3-4 months  Take amlodipine daily  Check the  blood pressure   weekly   Be sure your blood pressure is between 110/65 and  135/85. If it is consistently higher or lower, let me know    DASH Eating Plan DASH stands for "Dietary Approaches to Stop Hypertension." The DASH eating plan is a healthy eating plan that has been shown to reduce high blood pressure (hypertension). It may also reduce your risk for type 2 diabetes, heart disease, and stroke. The DASH eating plan may also help with weight loss. What are tips for following this plan? General guidelines  Avoid eating more than 2,300 mg (milligrams) of salt (sodium) a day. If you have hypertension, you may need to reduce your sodium intake to 1,500 mg a day.  Limit alcohol intake to no more than 1 drink a day for nonpregnant women and 2 drinks a day for men. One drink equals 12 oz of beer, 5 oz of wine, or 1 oz of hard liquor.  Work with your health care provider to maintain a healthy body weight or to lose weight. Ask what an ideal weight is for you.  Get at least 30 minutes of exercise that causes your heart to beat faster (aerobic exercise) most days of the week. Activities may include walking, swimming, or biking.  Work with your health care provider or diet and nutrition specialist (dietitian) to adjust your eating plan to your individual calorie needs. Reading food labels  Check food labels for the amount of sodium per serving. Choose foods with less than 5 percent of the Daily Value of sodium. Generally, foods with less than 300 mg of sodium per serving fit into this eating plan.  To find whole grains, look for the word "whole" as the first word in the ingredient list. Shopping  Buy products labeled as "low-sodium" or "no salt added."  Buy fresh foods. Avoid canned foods and premade or frozen  meals. Cooking  Avoid adding salt when cooking. Use salt-free seasonings or herbs instead of table salt or sea salt. Check with your health care provider or pharmacist before using salt substitutes.  Do not fry foods. Cook foods using healthy methods such as baking, boiling, grilling, and broiling instead.  Cook with heart-healthy oils, such as olive, canola, soybean, or sunflower oil. Meal planning   Eat a balanced diet that includes: ? 5 or more servings of fruits and vegetables each day. At each meal, try to fill half of your plate with fruits and vegetables. ? Up to 6-8 servings of whole grains each day. ? Less than 6 oz of lean meat, poultry, or fish each day. A 3-oz serving of meat is about the same size as a deck of cards. One egg equals 1 oz. ? 2 servings of low-fat dairy each day. ? A serving of nuts, seeds, or beans 5 times each week. ? Heart-healthy fats. Healthy fats called Omega-3 fatty acids are found in foods such as flaxseeds and coldwater fish, like sardines, salmon, and mackerel.  Limit how much you eat of the following: ? Canned or prepackaged foods. ? Food that is high in trans fat, such as fried foods. ? Food that is high in saturated fat, such as fatty meat. ? Sweets, desserts, sugary drinks, and other foods with added sugar. ? Full-fat dairy products.  Do not salt foods before eating.  Try to eat at least 2 vegetarian meals each week.  Eat more home-cooked food and less restaurant, buffet, and fast food.  When eating at a restaurant, ask that your food be prepared with less salt or no salt, if possible. What foods are recommended? The items listed may not be a complete list. Talk with your dietitian about what dietary choices are best for you. Grains Whole-grain or whole-wheat bread. Whole-grain or whole-wheat pasta. Brown rice. [place]. Bulgur. Whole-grain and low-sodium cereals. Pita bread. Low-fat, low-sodium crackers. Whole-wheat flour  tortillas. Vegetables Fresh or frozen vegetables (raw, steamed, roasted, or grilled). Low-sodium or reduced-sodium tomato and vegetable juice. Low-sodium or reduced-sodium tomato sauce and tomato paste. Low-sodium or reduced-sodium canned vegetables. Fruits All fresh, dried, or frozen fruit. Canned fruit in natural juice (without added sugar). Meat and other protein foods Skinless chicken or [place]. Ground chicken or [place]. Pork with fat trimmed off. Fish and seafood. Egg whites. Dried beans, peas, or lentils. Unsalted nuts, nut butters, and seeds. Unsalted canned beans. Lean cuts of beef with fat trimmed off. Low-sodium, lean deli meat. Dairy Low-fat (1%) or fat-free (skim) milk. Fat-free, low-fat, or reduced-fat cheeses. Nonfat, low-sodium ricotta or cottage cheese. Low-fat or nonfat yogurt. Low-fat, low-sodium cheese. Fats and oils Soft margarine without trans fats. Vegetable oil. Low-fat, reduced-fat, or light mayonnaise and salad dressings (reduced-sodium). Canola, safflower, olive, soybean, and sunflower oils. Avocado. Seasoning and other foods Herbs. Spices. Seasoning mixes without salt. Unsalted popcorn and pretzels. Fat-free sweets. What foods are not recommended? The items listed may not be a complete list. Talk with your dietitian about what dietary choices are best for you. Grains Baked goods made with fat, such as croissants, muffins, or some breads. Dry pasta or rice meal packs. Vegetables Creamed or fried vegetables. Vegetables in a cheese sauce. Regular canned vegetables (not low-sodium or reduced-sodium). Regular canned tomato sauce and paste (not low-sodium or reduced-sodium). Regular tomato and vegetable juice (not low-sodium or reduced-sodium). Angie Fava. Olives. Fruits Canned fruit in a light or heavy syrup. Fried fruit. Fruit in cream or butter sauce. Meat and other protein foods Fatty cuts of meat. Ribs. Fried meat. Berniece Salines. Sausage. Bologna and other processed lunch meats.  Salami. Fatback. Hotdogs. Bratwurst. Salted nuts and seeds. Canned beans with added salt. Canned or smoked fish. Whole eggs or egg yolks. Chicken or [place] with skin. Dairy Whole or 2% milk, cream, and half-and-half. Whole or full-fat cream cheese. Whole-fat or sweetened yogurt. Full-fat cheese. Nondairy creamers. Whipped toppings. Processed cheese and cheese spreads. Fats and oils Butter. Stick margarine. Lard. Shortening. Ghee. Bacon fat. Tropical oils, such as coconut, palm kernel, or palm oil. Seasoning and other foods Salted popcorn and pretzels. Onion salt, garlic salt, seasoned salt, table salt, and sea salt. Worcestershire sauce. Tartar sauce. Barbecue sauce. Teriyaki sauce. Soy sauce, including reduced-sodium. Steak sauce. Canned and packaged gravies. Fish sauce. Oyster sauce. Cocktail sauce. Horseradish that you find on the shelf. Ketchup. Mustard. Meat flavorings and tenderizers. Bouillon cubes. Hot sauce and Tabasco sauce. Premade or packaged marinades. Premade or packaged taco seasonings. Relishes. Regular salad dressings. Where to find more information:  National Heart, Lung, and [place]: https://wilson-eaton.com/  American Heart Association: www.heart.org Summary  The DASH eating plan is a healthy eating plan that has been shown to reduce high blood pressure (hypertension). It may also reduce your risk for type 2 diabetes, heart disease, and stroke.  With the DASH eating plan, you should limit salt (sodium)  intake to 2,300 mg a day. If you have hypertension, you may need to reduce your sodium intake to 1,500 mg a day.  When on the DASH eating plan, aim to eat more fresh fruits and vegetables, whole grains, lean proteins, low-fat dairy, and heart-healthy fats.  Work with your health care provider or diet and nutrition specialist (dietitian) to adjust your eating plan to your individual calorie needs. This information is not intended to replace advice given to you by your health  care provider. Make sure you discuss any questions you have with your health care provider. Document Released: 03/03/2011 Document Revised: 03/07/2016 Document Reviewed: 03/07/2016 Elsevier Interactive Patient Education  2017 [employer].

## 2017-02-04 LAB — C. TRACHOMATIS/N. GONORRHOEAE RNA
C. trachomatis RNA, TMA: NOT DETECTED
N. gonorrhoeae RNA, TMA: NOT DETECTED

## 2017-02-06 LAB — HIV ANTIBODY (ROUTINE TESTING W REFLEX): HIV 1&2 Ab, 4th Generation: NONREACTIVE

## 2017-02-06 LAB — RPR: RPR Ser Ql: NONREACTIVE

## 2017-03-01 DIAGNOSIS — F4310 Post-traumatic stress disorder, unspecified: Secondary | ICD-10-CM | POA: Diagnosis not present

## 2017-03-30 DIAGNOSIS — F4310 Post-traumatic stress disorder, unspecified: Secondary | ICD-10-CM | POA: Diagnosis not present

## 2017-05-31 DIAGNOSIS — J020 Streptococcal pharyngitis: Secondary | ICD-10-CM | POA: Diagnosis not present

## 2017-07-04 DIAGNOSIS — J0301 Acute recurrent streptococcal tonsillitis: Secondary | ICD-10-CM | POA: Diagnosis not present

## 2017-07-10 DIAGNOSIS — H6123 Impacted cerumen, bilateral: Secondary | ICD-10-CM | POA: Diagnosis not present

## 2017-07-10 DIAGNOSIS — J3501 Chronic tonsillitis: Secondary | ICD-10-CM | POA: Diagnosis not present

## 2017-07-19 ENCOUNTER — Telehealth: Payer: Self-pay | Admitting: Medical

## 2017-07-19 MED ORDER — NYSTATIN-TRIAMCINOLONE 100000-0.1 UNIT/GM-% EX CREA: 1.0000 | TOPICAL_CREAM | Freq: Two times a day (BID) | CUTANEOUS | 0 refills | Status: DC

## 2017-07-19 NOTE — Telephone Encounter (Signed)
Pt requesting refill on Mycolog II cream. Please advise.

## 2017-07-19 NOTE — Telephone Encounter (Signed)
Sent!

## 2017-09-25 ENCOUNTER — Other Ambulatory Visit: Payer: Self-pay | Admitting: Medical

## 2017-09-25 DIAGNOSIS — H52223 Regular astigmatism, bilateral: Secondary | ICD-10-CM | POA: Diagnosis not present

## 2017-09-25 DIAGNOSIS — H5213 Myopia, bilateral: Secondary | ICD-10-CM | POA: Diagnosis not present

## 2017-11-02 ENCOUNTER — Other Ambulatory Visit: Payer: Self-pay | Admitting: Internal Medicine

## 2017-11-07 ENCOUNTER — Other Ambulatory Visit: Payer: Self-pay | Admitting: Internal Medicine

## 2017-11-07 MED ORDER — AMLODIPINE BESYLATE 10 MG PO TABS: 10.0000 mg | ORAL_TABLET | Freq: Every day | ORAL | 0 refills | Status: DC

## 2017-11-07 NOTE — Telephone Encounter (Signed)
Can pt have refill of Amlodipine until scheduled appt on 8/26?

## 2017-11-07 NOTE — Telephone Encounter (Signed)
Left detailed message on machine that rx was sent in and he needs to keep appt.

## 2017-11-07 NOTE — Telephone Encounter (Signed)
Copied from CRM [id number]. Topic: Quick Communication - Rx Refill/Question >> Nov 07, 2017  4:01 PM Stephannie LiSimmons, Bindu Docter L, NT wrote: Medication: amLODipine (NORVASC) 10 MG tablet    patient has an appointment on 11/20/17 at 10;20  Has the patient contacted their pharmacy? yes  (Agent: If no, request that the patient contact the pharmacy for the refill (Agent: If yes, when and what did the pharmacy advise?)  Preferred Pharmacy (with phone number or street name  [id number] - [place], [place] - [address] [phone] (Phone) [phone] (Fax)    Agent: Please be advised that RX refills may take up to 3 business days. We ask that you follow-up with your pharmacy.

## 2017-11-20 ENCOUNTER — Encounter: Payer: Self-pay | Admitting: Internal Medicine

## 2017-11-20 ENCOUNTER — Ambulatory Visit (INDEPENDENT_AMBULATORY_CARE_PROVIDER_SITE_OTHER): Payer: BLUE CROSS/BLUE SHIELD | Admitting: Internal Medicine

## 2017-11-20 VITALS — BP 172/100 | HR 69 | Temp 98.3°F | Resp 16 | Ht 68.0 in | Wt 248.0 lb

## 2017-11-20 DIAGNOSIS — Z09 Encounter for follow-up examination after completed treatment for conditions other than malignant neoplasm: Secondary | ICD-10-CM | POA: Insufficient documentation

## 2017-11-20 DIAGNOSIS — I10 Essential (primary) hypertension: Secondary | ICD-10-CM

## 2017-11-20 HISTORY — DX: Encounter for follow-up examination after completed treatment for conditions other than malignant neoplasm: Z09

## 2017-11-20 LAB — BASIC METABOLIC PANEL
BUN: 17 mg/dL (ref 6–23)
CO2: 31 meq/L (ref 19–32)
CREATININE: 0.79 mg/dL (ref 0.40–1.50)
Calcium: 9.9 mg/dL (ref 8.4–10.5)
Chloride: 100 mEq/L (ref 96–112)
GFR: 115.92 mL/min (ref >60.00)
GLUCOSE: 87 mg/dL (ref 70–99)
Potassium: 4.4 mEq/L (ref 3.5–5.1)
Sodium: 138 mEq/L (ref 135–145)

## 2017-11-20 MED ORDER — LOSARTAN POTASSIUM 50 MG PO TABS: 50.0000 mg | ORAL_TABLET | Freq: Every day | ORAL | 1 refills | Status: DC

## 2017-11-20 NOTE — Patient Instructions (Addendum)
GO TO THE LAB : Get the blood work     GO TO THE FRONT DESK  --Come back in 2 weeks for blood work again --Schedule your next appointment for a  Physical in 2 months, fasting     Check the  blood pressure 2 or 3 times a  Week  Be sure your blood pressure is between 110/65 and  135/85. If it is consistently higher or lower, let me know

## 2017-11-20 NOTE — Assessment & Plan Note (Signed)
HTN: Good compliance with amlodipine 10 mg daily, BP up on arrival 170/100, I checked it personally and confirmed: 170/105.  Although I typically do not change medications based on a single reading I am somewhat concerned about his blood pressure being 170.  He is asymptomatic. Plan: BMP, continue amlodipine, add losartan 50 mg, BMP in 2 weeks.  We had a long discussion about diet, low-salt, weight loss.  Calorie count?. RTC 2 weeks blood work, 2 months CPX.

## 2017-11-20 NOTE — Progress Notes (Signed)
Subjective:    Patient ID: Mathew Daniels, male    DOB: 1980-08-16, 37 y.o.   MRN: [id number]  DOS:  11/20/2017 Type of visit - description : f/u Interval history: HTN: Reports good compliance with amlodipine, no ambulatory BPs. Anxiety: Very rarely takes clonazepam.  Review of Systems  Denies lower extrem edema definitely room for improvement on his diet and exercise. Denies headaches, chest pain, difficulty breathing.  Past Medical History:  Diagnosis Date  . ADD   . Bronchospasm   . Environmental allergies   . H/O: depression   . HTN     Past Surgical History:  Procedure Laterality Date  . TENDON REPAIR Right 1998   ankle    Social History   Socioeconomic History  . Marital status: Single    Spouse name: Not on file  . Number of children: 1  . Years of education: Not on file  . Highest education level: Not on file  Occupational History  . Occupation: finances   Social Needs  . Financial resource strain: Not on file  . Food insecurity:    Worry: Not on file    Inability: Not on file  . Transportation needs:    Medical: Not on file    Non-medical: Not on file  Tobacco Use  . Smoking status: Former Games developer  . Smokeless tobacco: Former Neurosurgeon  . Tobacco comment: quit smoking 2012, quit chewing 2014  Substance and Sexual Activity  . Alcohol use: Yes    Comment: socially  . Drug use: No  . Sexual activity: Not on file  Lifestyle  . Physical activity:    Days per week: Not on file    Minutes per session: Not on file  . Stress: Not on file  Relationships  . Social connections:    Talks on phone: Not on file    Gets together: Not on file    Attends religious service: Not on file    Active member of club or organization: Not on file    Attends meetings of clubs or organizations: Not on file    Relationship status: Not on file  . Intimate partner violence:    Fear of current or ex partner: Not on file    Emotionally abused: Not on file    Physically  abused: Not on file    Forced sexual activity: Not on file  Other Topics Concern  . Not on file  Social History Narrative   Married, 1 daughter born 2012 , 2 step kids                     Allergies as of 11/20/2017      Reactions   Amoxicillin    Blisters feet hand July 2013, took amoxicillin before w/o problems   Carvedilol    REACTION: NAUSEA   Penicillins Other (See Comments)   Blisters feet hand July 2013, took amoxicillin before w/o problems Blisters feet hand July 2013, took amoxicillin before w/o problems Peeling on feet      Medication List        Accurate as of 11/20/17  3:43 PM. Always use your most recent med list.          amLODipine 10 MG tablet Commonly known as:  NORVASC Take 1 tablet (10 mg total) by mouth daily.   clonazePAM 0.5 MG tablet Commonly known as:  KLONOPIN Take 1 tablet (0.5 mg total) 2 (two) times daily as needed by mouth for anxiety.  losartan 50 MG tablet Commonly known as:  COZAAR Take 1 tablet (50 mg total) by mouth daily.   multivitamin tablet Take 1 tablet by mouth daily. Optimen   nystatin-triamcinolone cream Commonly known as:  MYCOLOG II Apply 1 application topically 2 (two) times daily.          Objective:   Physical Exam BP (!) 172/100 (BP Location: Left Arm, Patient Position: Sitting, Cuff Size: Large)   Pulse 69   Temp 98.3 F (36.8 C) (Oral)   Resp 16   Ht 5\' 8"  (1.727 m)   Wt 248 lb (112.5 kg)   SpO2 97%   BMI 37.71 kg/m   General:   Well developed, NAD, see BMI.  HEENT:  Normocephalic . Face symmetric, atraumatic Lungs:  CTA B Normal respiratory effort, no intercostal retractions, no accessory muscle use. Heart: RRR,  no murmur.  No pretibial edema bilaterally  Skin: Not pale. Not jaundice Neurologic:  alert & oriented X3.  Speech normal, gait appropriate for age and unassisted Psych--  Cognition and judgment appear intact.  Cooperative with normal attention span and concentration.    Behavior appropriate. No anxious or depressed appearing.      Assessment & Plan:   Assessment (new patient 2014) HTN dx ~ 34 Anxiety ADHD Tobacco   PLAN: HTN: Good compliance with amlodipine 10 mg daily, BP up on arrival 170/100, I checked it personally and confirmed: 170/105.  Although I typically do not change medications based on a single reading I am somewhat concerned about his blood pressure being 170.  He is asymptomatic. Plan: BMP, continue amlodipine, add losartan 50 mg, BMP in 2 weeks.  We had a long discussion about diet, low-salt, weight loss.  Calorie count?. RTC 2 weeks blood work, 2 months CPX.

## 2017-11-21 ENCOUNTER — Encounter: Payer: Self-pay | Admitting: Internal Medicine

## 2017-12-04 ENCOUNTER — Other Ambulatory Visit: Payer: BLUE CROSS/BLUE SHIELD

## 2017-12-10 ENCOUNTER — Other Ambulatory Visit: Payer: Self-pay

## 2017-12-10 ENCOUNTER — Encounter (HOSPITAL_COMMUNITY): Payer: Self-pay | Admitting: Emergency Medicine

## 2017-12-10 ENCOUNTER — Emergency Department (HOSPITAL_COMMUNITY)
Admission: EM | Admit: 2017-12-10 | Discharge: 2017-12-10 | Disposition: A | Discharge status: Home / Self Care | Payer: BLUE CROSS/BLUE SHIELD | Attending: Emergency Medicine | Admitting: Emergency Medicine

## 2017-12-10 DIAGNOSIS — Z79899 Other long term (current) drug therapy: Secondary | ICD-10-CM | POA: Insufficient documentation

## 2017-12-10 DIAGNOSIS — R040 Epistaxis: Secondary | ICD-10-CM | POA: Insufficient documentation

## 2017-12-10 DIAGNOSIS — I10 Essential (primary) hypertension: Secondary | ICD-10-CM | POA: Diagnosis not present

## 2017-12-10 DIAGNOSIS — Z87891 Personal history of nicotine dependence: Secondary | ICD-10-CM | POA: Diagnosis not present

## 2017-12-10 DIAGNOSIS — F909 Attention-deficit hyperactivity disorder, unspecified type: Secondary | ICD-10-CM | POA: Insufficient documentation

## 2017-12-10 MED ORDER — OXYMETAZOLINE HCL 0.05 % NA SOLN
1.0000 | Freq: Once | NASAL | Status: AC
  Administered 2017-12-10: 1 via NASAL
  Filled 2017-12-10: qty 15

## 2017-12-10 NOTE — ED Triage Notes (Signed)
Pt reports a fly when up his nose around 0730 this am, states that he was trying to get it out and his nose began bleeding. Pt states nose has been bleeding since, feels like he is swallowing blood, pt reports seeing blood each time he blows his nose. Not on blood thinners, bp elevated in triage.

## 2017-12-10 NOTE — ED Provider Notes (Signed)
[facility] EMERGENCY DEPARTMENT Provider Note   CSN: [id number] Arrival date & time: 12/10/17  1306     History   Chief Complaint Chief Complaint  Patient presents with  . Epistaxis    HPI Mathew Daniels is a 37 y.o. male.  The history is provided by the patient.  Epistaxis   This is a new problem. The current episode started 3 to 5 hours ago. The problem has been resolved. The problem is associated with nose-picking. The bleeding has been from the left nare. He has tried applying pressure for the symptoms. The treatment provided significant relief. His past medical history is significant for nose-picking (stuck finger in nose when a fly was around his face). His past medical history does not include bleeding disorder, colds, sinus problems or allergies.    Past Medical History:  Diagnosis Date  . ADD   . Bronchospasm   . Environmental allergies   . H/O: depression   . HTN     Patient Active Problem List   Diagnosis Date Noted  . PCP Notes>>>> 11/20/2017  . Allergic rhinitis 08/01/2013  . Annual physical exam 06/26/2012  . anxiety 03/24/2008  . ADHD 03/24/2008  . Essential hypertension 03/24/2008  . ALLERGIC RHINITIS 03/24/2008    Past Surgical History:  Procedure Laterality Date  . TENDON REPAIR Right 1998   ankle        Home Medications    Prior to Admission medications   Medication Sig Start Date End Date Taking? Authorizing Provider  amLODipine (NORVASC) 10 MG tablet Take 1 tablet (10 mg total) by mouth daily. 11/07/17  Yes Paz, Nolon Rod, MD  clonazePAM (KLONOPIN) 0.5 MG tablet Take 1 tablet (0.5 mg total) 2 (two) times daily as needed by mouth for anxiety. 02/03/17  Yes Paz, Nolon Rod, MD  losartan (COZAAR) 50 MG tablet Take 1 tablet (50 mg total) by mouth daily. 11/20/17  Yes Paz, Nolon Rod, MD  Multiple Vitamin (MULTIVITAMIN) tablet Take 1 tablet by mouth daily. Optimen   Yes [provider]  nystatin-triamcinolone (MYCOLOG II)  cream Apply 1 application topically 2 (two) times daily. Patient not taking: Reported on 12/10/2017 07/19/17   Wanda Plump, MD    Family History Family History  Problem Relation Age of Onset  . CAD Father 11       MI x 2 CABG  . Diabetes Father   . Stroke Father   . CAD Other        GF MI  . Colon cancer Neg Hx   . Cancer - Prostate Neg Hx     Social History Social History   Tobacco Use  . Smoking status: Former Games developer  . Smokeless tobacco: Former Neurosurgeon  . Tobacco comment: quit smoking 2012, quit chewing 2014  Substance Use Topics  . Alcohol use: Yes    Comment: socially  . Drug use: No     Allergies   Amoxicillin and Penicillins   Review of Systems Review of Systems  Constitutional: Negative for chills and fever.  HENT: Positive for nosebleeds. Negative for ear pain and sore throat.   Eyes: Negative for pain and visual disturbance.  Respiratory: Negative for cough and shortness of breath.   Cardiovascular: Negative for chest pain and palpitations.  Gastrointestinal: Negative for abdominal pain and vomiting.  Genitourinary: Negative for dysuria and hematuria.  Musculoskeletal: Negative for arthralgias and back pain.  Skin: Negative for color change and rash.  Neurological: Negative for seizures and  syncope.  All other systems reviewed and are negative.    Physical Exam Updated Vital Signs  ED Triage Vitals [12/10/17 1333]  Enc Vitals Group     BP (!) 187/122     Pulse Rate 95     Resp 18     Temp 98.2 F (36.8 C)     Temp Source Oral     SpO2 98 %     Weight      Height      Head Circumference      Peak Flow      Pain Score 0     Pain Loc      Pain Edu?      Excl. in GC?     Physical Exam  Constitutional: He appears well-developed and well-nourished.  HENT:  Head: Normocephalic and atraumatic.  Nose: Nose normal.  Mouth/Throat: Oropharynx is clear and moist. No oropharyngeal exudate.  No nasal septal hematoma, no signs of active nosebleed,  posterior pharynx without bleeding  Eyes: Pupils are equal, round, and reactive to light. Conjunctivae and EOM are normal. Left eye exhibits no discharge. No scleral icterus.  Neck: Normal range of motion. Neck supple.  Cardiovascular: Normal rate and regular rhythm.  No murmur heard. Pulmonary/Chest: Effort normal and breath sounds normal. No respiratory distress.  Abdominal: Soft. There is no tenderness.  Musculoskeletal: He exhibits no edema.  Neurological: He is alert.  Skin: Skin is warm and dry.  Psychiatric: He has a normal mood and affect.  Nursing note and vitals reviewed.    ED Treatments / Results  Labs (all labs ordered are listed, but only abnormal results are displayed) Labs Reviewed - No data to display  EKG None  Radiology No results found.  Procedures Procedures (including critical care time)  Medications Ordered in ED Medications  oxymetazoline (AFRIN) 0.05 % nasal spray 1 spray (1 spray Each Nare Given 12/10/17 1455)     Initial Impression / Assessment and Plan / ED Course  I have reviewed the triage vital signs and the nursing notes.  Pertinent labs & imaging results that were available during my care of the patient were reviewed by me and considered in my medical decision making (see chart for details).     Mathew Daniels is a 37 year old male with history of hypertension who presents to the ED with nosebleed.  Patient with normal vitals except for mild hypertension.  Patient accidentally stuck his finger in his nose when a fly was flying around his head.  Had some bleeding from his nose that has now stopped.  He applied direct pressure.  Patient with no signs of nasal septal hematoma.  No signs of active bleeding.  No blood in the posterior pharynx.  Afrin was applied and patient given instructions on further Afrin use.  Discharged from ED in good condition.  Patient is not on any blood thinners.  Told to return to the ED if symptoms worsen.  Final  Clinical Impressions(s) / ED Diagnoses   Final diagnoses:  Epistaxis    ED Discharge Orders    None       Virgina NorfolkCuratolo, Timmya Blazier, DO 12/10/17 1513

## 2017-12-10 NOTE — ED Notes (Signed)
Patient verbalizes understanding of discharge instructions. Opportunity for questioning and answers were provided. Armband removed by staff, pt discharged from ED.  

## 2017-12-10 NOTE — ED Notes (Signed)
EDP at bedside  

## 2017-12-11 ENCOUNTER — Other Ambulatory Visit (INDEPENDENT_AMBULATORY_CARE_PROVIDER_SITE_OTHER): Payer: BLUE CROSS/BLUE SHIELD

## 2017-12-11 DIAGNOSIS — I10 Essential (primary) hypertension: Secondary | ICD-10-CM | POA: Diagnosis not present

## 2017-12-12 LAB — BASIC METABOLIC PANEL
BUN: 18 mg/dL (ref 6–23)
CALCIUM: 9.6 mg/dL (ref 8.4–10.5)
CO2: 27 mEq/L (ref 19–32)
Chloride: 101 mEq/L (ref 96–112)
Creatinine, Ser: 0.79 mg/dL (ref 0.40–1.50)
GFR: 115.88 mL/min (ref >60.00)
GLUCOSE: 83 mg/dL (ref 70–99)
POTASSIUM: 4.6 meq/L (ref 3.5–5.1)
Sodium: 139 mEq/L (ref 135–145)

## 2017-12-18 ENCOUNTER — Other Ambulatory Visit: Payer: Self-pay | Admitting: Internal Medicine

## 2017-12-19 MED ORDER — CLONAZEPAM 0.5 MG PO TABS: 0.5000 mg | ORAL_TABLET | Freq: Two times a day (BID) | ORAL | 0 refills | Status: DC | PRN

## 2017-12-19 MED ORDER — AMLODIPINE BESYLATE 10 MG PO TABS: 10.0000 mg | ORAL_TABLET | Freq: Every day | ORAL | 1 refills | Status: DC

## 2017-12-19 NOTE — Telephone Encounter (Signed)
printed

## 2017-12-19 NOTE — Telephone Encounter (Signed)
Pt is requesting refill on clonazepam 0.5mg .  Last OV: 11/20/2017 Last Fill: 02/03/2017 #20 and 0RF UDS: None- Pt uses only prn for flying  NCCR printed- Pt received hydrocodone-acetaminophen 10-325mg  #25 tablets from Dr. Shon [place] on 08/01/2017- no other discrepancies- NCCR sent for scanning.

## 2017-12-19 NOTE — Telephone Encounter (Signed)
Rx faxed to [facility]

## 2018-01-04 DIAGNOSIS — H10413 Chronic giant papillary conjunctivitis, bilateral: Secondary | ICD-10-CM | POA: Diagnosis not present

## 2018-01-22 ENCOUNTER — Other Ambulatory Visit: Payer: Self-pay | Admitting: Internal Medicine

## 2018-01-24 ENCOUNTER — Encounter: Payer: BLUE CROSS/BLUE SHIELD | Admitting: Internal Medicine

## 2018-02-20 ENCOUNTER — Other Ambulatory Visit: Payer: Self-pay | Admitting: Internal Medicine

## 2018-06-07 ENCOUNTER — Other Ambulatory Visit: Payer: Self-pay | Admitting: Internal Medicine

## 2018-06-07 NOTE — Telephone Encounter (Signed)
Requesting: Clonazepam Contract:No UDS: No Last OV: 11/20/2017 Next OV: N/A Last Refill: 12/19/2017, #20--0 RF  Database:   Please advise

## 2018-06-12 ENCOUNTER — Encounter: Payer: Self-pay | Admitting: Internal Medicine

## 2018-06-12 MED ORDER — AMLODIPINE BESYLATE 10 MG PO TABS: 10.0000 mg | ORAL_TABLET | Freq: Every day | ORAL | 0 refills | Status: DC

## 2018-06-12 MED ORDER — CLONAZEPAM 0.5 MG PO TABS: 0.5000 mg | ORAL_TABLET | Freq: Two times a day (BID) | ORAL | 0 refills | Status: DC | PRN

## 2018-06-12 NOTE — Telephone Encounter (Signed)
Call or send the patient a message: Let him know that I sent a prescription for clonazepam but  no further refills without office visit.

## 2018-10-31 ENCOUNTER — Encounter: Payer: Self-pay | Admitting: Internal Medicine

## 2018-11-26 ENCOUNTER — Emergency Department (HOSPITAL_COMMUNITY)
Admission: EM | Admit: 2018-11-26 | Discharge: 2018-11-26 | Disposition: A | Discharge status: Home / Self Care | Payer: BC Managed Care – PPO | Attending: Emergency Medicine | Admitting: Emergency Medicine

## 2018-11-26 ENCOUNTER — Encounter (HOSPITAL_COMMUNITY): Payer: Self-pay

## 2018-11-26 ENCOUNTER — Other Ambulatory Visit: Payer: Self-pay

## 2018-11-26 DIAGNOSIS — Z7289 Other problems related to lifestyle: Secondary | ICD-10-CM | POA: Diagnosis not present

## 2018-11-26 DIAGNOSIS — I10 Essential (primary) hypertension: Secondary | ICD-10-CM | POA: Insufficient documentation

## 2018-11-26 DIAGNOSIS — R040 Epistaxis: Secondary | ICD-10-CM

## 2018-11-26 DIAGNOSIS — Z87891 Personal history of nicotine dependence: Secondary | ICD-10-CM | POA: Diagnosis not present

## 2018-11-26 DIAGNOSIS — Z79899 Other long term (current) drug therapy: Secondary | ICD-10-CM | POA: Insufficient documentation

## 2018-11-26 DIAGNOSIS — F17290 Nicotine dependence, other tobacco product, uncomplicated: Secondary | ICD-10-CM | POA: Diagnosis not present

## 2018-11-26 DIAGNOSIS — J3489 Other specified disorders of nose and nasal sinuses: Secondary | ICD-10-CM | POA: Diagnosis not present

## 2018-11-26 HISTORY — DX: Epistaxis: R04.0

## 2018-11-26 MED ORDER — OXYMETAZOLINE HCL 0.05 % NA SOLN: 1.0000 | Freq: Once | NASAL | Status: DC

## 2018-11-26 NOTE — ED Notes (Signed)
Pt reports nose is still bleeding around packaging. EDP verbalized to place and additional 68mL of air. 105mL was added, will continue to monitor.

## 2018-11-26 NOTE — ED Provider Notes (Addendum)
[facility]-EMERGENCY DEPT Provider Note   CSN: [id number] Arrival date & time: 11/26/18  0112     History   Chief Complaint Chief Complaint  Patient presents with  . Epistaxis    HPI Mathew Daniels is a 38 y.o. male.     Patient presents to the emergency department for evaluation of nosebleed.  Patient reports that he has been having heavy bleeding down the back of his throat and from the left side of his nose tonight.  He has used Afrin multiple times prior to arrival without any slowing of the bleeding.     Past Medical History:  Diagnosis Date  . ADD   . Bronchospasm   . Environmental allergies   . H/O: depression   . HTN     Patient Active Problem List   Diagnosis Date Noted  . PCP Notes>>>> 11/20/2017  . Allergic rhinitis 08/01/2013  . Annual physical exam 06/26/2012  . anxiety 03/24/2008  . ADHD 03/24/2008  . Essential hypertension 03/24/2008  . ALLERGIC RHINITIS 03/24/2008    Past Surgical History:  Procedure Laterality Date  . TENDON REPAIR Right 1998   ankle        Home Medications    Prior to Admission medications   Medication Sig Start Date End Date Taking? Authorizing Provider  amLODipine (NORVASC) 10 MG tablet Take 1 tablet (10 mg total) by mouth daily. 06/12/18   Wanda PlumpPaz, Jose E, MD  clonazePAM (KLONOPIN) 0.5 MG tablet Take 1 tablet (0.5 mg total) by mouth 2 (two) times daily as needed for anxiety. 06/12/18   Wanda PlumpPaz, Jose E, MD  losartan (COZAAR) 50 MG tablet Take 1 tablet (50 mg total) by mouth daily. 01/22/18   Wanda PlumpPaz, Jose E, MD  Multiple Vitamin (MULTIVITAMIN) tablet Take 1 tablet by mouth daily. Optimen    [provider]  nystatin-triamcinolone (MYCOLOG II) cream Apply 1 application topically 2 (two) times daily. Patient not taking: Reported on 12/10/2017 07/19/17   Wanda PlumpPaz, Jose E, MD    Family History Family History  Problem Relation Age of Onset  . CAD Father 2340       MI x 2 CABG  . Diabetes Father   .  Stroke Father   . CAD Other        GF MI  . Colon cancer Neg Hx   . Cancer - Prostate Neg Hx     Social History Social History   Tobacco Use  . Smoking status: Former Games developermoker  . Smokeless tobacco: Former NeurosurgeonUser  . Tobacco comment: quit smoking 2012, quit chewing 2014  Substance Use Topics  . Alcohol use: Yes    Comment: socially  . Drug use: No     Allergies   Amoxicillin and Penicillins   Review of Systems Review of Systems  HENT: Positive for nosebleeds.   All other systems reviewed and are negative.    Physical Exam Updated Vital Signs BP (!) 176/113   Pulse 85   Temp 98.5 F (36.9 C) (Oral)   Resp 16   Ht 5\' 6"  (1.676 m)   Wt 111.1 kg   SpO2 96%   BMI 39.54 kg/m   Physical Exam Vitals signs and nursing note reviewed.  Constitutional:      General: He is not in acute distress.    Appearance: Normal appearance. He is well-developed.  HENT:     Head: Normocephalic and atraumatic.     Right Ear: Hearing normal.     Left Ear:  Hearing normal.     Nose:     Left Nostril: Epistaxis present.  Eyes:     Conjunctiva/sclera: Conjunctivae normal.     Pupils: Pupils are equal, round, and reactive to light.  Neck:     Musculoskeletal: Normal range of motion and neck supple.  Cardiovascular:     Rate and Rhythm: Regular rhythm.     Heart sounds: S1 normal and S2 normal. No murmur. No friction rub. No gallop.   Pulmonary:     Effort: Pulmonary effort is normal. No respiratory distress.     Breath sounds: Normal breath sounds.  Chest:     Chest wall: No tenderness.  Abdominal:     General: Bowel sounds are normal.     Palpations: Abdomen is soft.     Tenderness: There is no abdominal tenderness. There is no guarding or rebound. Negative signs include Murphy's sign and McBurney's sign.     Hernia: No hernia is present.  Musculoskeletal: Normal range of motion.  Skin:    General: Skin is warm and dry.     Findings: No rash.  Neurological:     Mental Status:  He is alert and oriented to person, place, and time.     GCS: GCS eye subscore is 4. GCS verbal subscore is 5. GCS motor subscore is 6.     Cranial Nerves: No cranial nerve deficit.     Sensory: No sensory deficit.     Coordination: Coordination normal.  Psychiatric:        Speech: Speech normal.        Behavior: Behavior normal.        Thought Content: Thought content normal.      ED Treatments / Results  Labs (all labs ordered are listed, but only abnormal results are displayed) Labs Reviewed - No data to display  EKG None  Radiology No results found.  Procedures .Epistaxis Management  Date/Time: 11/26/2018 4:38 AM Performed by: Gilda Crease, MD Authorized by: Gilda Crease, MD   Consent:    Consent obtained:  Verbal   Consent given by:  Patient   Risks discussed:  Bleeding and pain Universal protocol:    Procedure explained and questions answered to patient or proxy's satisfaction: yes     Site/side marked: yes     Time out called: yes     Patient identity confirmed:  Verbally with patient Anesthesia (see MAR for exact dosages):    Anesthesia method:  None Procedure details:    Treatment site:  L posterior   Treatment method:  Nasal balloon   Treatment episode: initial   Post-procedure details:    Assessment:  Bleeding stopped   Patient tolerance of procedure:  Tolerated well, no immediate complications   (including critical care time)  Medications Ordered in ED Medications  oxymetazoline (AFRIN) 0.05 % nasal spray 1 spray (0 sprays Each Nare Hold 11/26/18 0136)     Initial Impression / Assessment and Plan / ED Course  I have reviewed the triage vital signs and the nursing notes.  Pertinent labs & imaging results that were available during my care of the patient were reviewed by me and considered in my medical decision making (see chart for details).        Patient presents with left-sided nosebleed.  Examination does not reveal  any anterior source of bleeding, blood appears to be coming from above the anterior visualized region and also is draining down the back of his throat.  This  is concerning for posterior bleed.  I discussed treatment options with the patient and recommended nasal balloon.  He agrees with this plan.  7.5 cm rapid Rhino balloon was placed. He initially had some residual bleeding, but this stopped with adding air to the balloon. He will follow-up with ENT in the office.  Return if he has worsening bleeding.  Final Clinical Impressions(s) / ED Diagnoses   Final diagnoses:  Epistaxis    ED Discharge Orders    None       Chloe Baig, Gwenyth Allegra, MD 11/26/18 5625    Orpah Greek, MD 11/26/18 [phone]

## 2018-11-26 NOTE — ED Triage Notes (Addendum)
Pt reports a nosebleed since about 5p. States that it calmed down around dinnertime, but then when he laid down to sleep, he started coughing up clots. Reports a hx of epistaxis. No blood thinners. Denies injury.

## 2018-11-26 NOTE — ED Notes (Signed)
Pt reports that he has used Afrin at home about 4 times PTA

## 2018-12-08 DIAGNOSIS — I10 Essential (primary) hypertension: Secondary | ICD-10-CM | POA: Diagnosis not present

## 2018-12-08 DIAGNOSIS — R591 Generalized enlarged lymph nodes: Secondary | ICD-10-CM | POA: Diagnosis not present

## 2018-12-08 DIAGNOSIS — K120 Recurrent oral aphthae: Secondary | ICD-10-CM | POA: Diagnosis not present

## 2018-12-08 DIAGNOSIS — J029 Acute pharyngitis, unspecified: Secondary | ICD-10-CM | POA: Diagnosis not present

## 2018-12-09 DIAGNOSIS — Z20828 Contact with and (suspected) exposure to other viral communicable diseases: Secondary | ICD-10-CM | POA: Diagnosis not present

## 2018-12-20 ENCOUNTER — Other Ambulatory Visit: Payer: Self-pay

## 2018-12-21 ENCOUNTER — Ambulatory Visit (INDEPENDENT_AMBULATORY_CARE_PROVIDER_SITE_OTHER): Payer: BC Managed Care – PPO | Admitting: Internal Medicine

## 2018-12-21 ENCOUNTER — Encounter: Payer: Self-pay | Admitting: Internal Medicine

## 2018-12-21 ENCOUNTER — Other Ambulatory Visit (HOSPITAL_COMMUNITY)
Admission: RE | Admit: 2018-12-21 | Discharge: 2018-12-21 | Disposition: A | Discharge status: Home / Self Care | Payer: BC Managed Care – PPO | Source: Ambulatory Visit | Attending: Internal Medicine | Admitting: Internal Medicine

## 2018-12-21 ENCOUNTER — Other Ambulatory Visit: Payer: Self-pay | Admitting: Internal Medicine

## 2018-12-21 VITALS — BP 177/104 | HR 96 | Temp 98.0°F | Resp 16 | Ht 68.0 in | Wt 256.2 lb

## 2018-12-21 DIAGNOSIS — I10 Essential (primary) hypertension: Secondary | ICD-10-CM | POA: Diagnosis not present

## 2018-12-21 DIAGNOSIS — Z23 Encounter for immunization: Secondary | ICD-10-CM

## 2018-12-21 DIAGNOSIS — Z113 Encounter for screening for infections with a predominantly sexual mode of transmission: Secondary | ICD-10-CM | POA: Insufficient documentation

## 2018-12-21 MED ORDER — LOSARTAN POTASSIUM 100 MG PO TABS: 100.0000 mg | ORAL_TABLET | Freq: Every day | ORAL | 0 refills | Status: DC

## 2018-12-21 MED ORDER — AMLODIPINE BESYLATE 10 MG PO TABS: 10.0000 mg | ORAL_TABLET | Freq: Every day | ORAL | 0 refills | Status: DC

## 2018-12-21 NOTE — Progress Notes (Signed)
Subjective:    Patient ID: Mathew Daniels, male    DOB: [birth year], 38 y.o.   MRN: [id number]  DOS:  12/21/2018 Type of visit - description: Routine checkup HTN: Reports poor compliance with medication.  No recent ambulatory BPs although I was able to obtain some BP readings from other clinics STD check?  Just like to be checked. He is divorced, admits to sexual activity sometimes unprotected.   Review of Systems Denies chest pain, difficulty breathing. No headaches No nausea, vomiting, diarrhea No dysuria, gross hematuria, penile discharge.  Past Medical History:  Diagnosis Date  . ADD   . Bronchospasm   . Environmental allergies   . H/O: depression   . HTN     Past Surgical History:  Procedure Laterality Date  . TENDON REPAIR Right 1998   ankle    Social History   Socioeconomic History  . Marital status: Single    Spouse name: Not on file  . Number of children: 1  . Years of education: Not on file  . Highest education level: Not on file  Occupational History  . Occupation: finances   Social Needs  . Financial resource strain: Not on file  . Food insecurity    Worry: Not on file    Inability: Not on file  . Transportation needs    Medical: Not on file    Non-medical: Not on file  Tobacco Use  . Smoking status: Former Games developer  . Smokeless tobacco: Former Neurosurgeon  . Tobacco comment: quit smoking 2012, quit chewing 2014  Substance and Sexual Activity  . Alcohol use: Yes    Comment: socially  . Drug use: No  . Sexual activity: Not on file  Lifestyle  . Physical activity    Days per week: Not on file    Minutes per session: Not on file  . Stress: Not on file  Relationships  . Social Musician on phone: Not on file    Gets together: Not on file    Attends religious service: Not on file    Active member of club or organization: Not on file    Attends meetings of clubs or organizations: Not on file    Relationship status: Not on file  .  Intimate partner violence    Fear of current or ex partner: Not on file    Emotionally abused: Not on file    Physically abused: Not on file    Forced sexual activity: Not on file  Other Topics Concern  . Not on file  Social History Narrative   Divorced 2017    1 daughter born 2012 , 2 step kids                  Allergies as of 12/21/2018      Reactions   Amoxicillin    Blisters feet hand July 2013, took amoxicillin before w/o problems   Penicillins Other (See Comments)   Blisters feet hand July 2013, took amoxicillin before w/o problems Blisters feet hand July 2013, took amoxicillin before w/o problems Peeling on feet      Medication List       Accurate as of December 21, 2018 11:59 PM. If you have any questions, ask your nurse or doctor.        amLODipine 10 MG tablet Commonly known as: NORVASC Take 1 tablet (10 mg total) by mouth daily.   clonazePAM 0.5 MG tablet Commonly known as: KLONOPIN Take 1  tablet (0.5 mg total) by mouth 2 (two) times daily as needed for anxiety.   losartan 100 MG tablet Commonly known as: COZAAR Take 1 tablet (100 mg total) by mouth daily. What changed:   medication strength  how much to take Changed by: Kathlene November, MD   multivitamin tablet Take 1 tablet by mouth daily. Optimen   nystatin-triamcinolone cream Commonly known as: MYCOLOG II Apply 1 application topically 2 (two) times daily.           Objective:   Physical Exam BP (!) 177/104 (BP Location: Left Arm, Patient Position: Sitting, Cuff Size: Normal)   Pulse 96   Temp 98 F (36.7 C) (Temporal)   Resp 16   Ht 5\' 8"  (1.727 m)   Wt 256 lb 4 oz (116.2 kg)   SpO2 98%   BMI 38.96 kg/m  General:   Well developed, NAD, BMI noted. HEENT:  Normocephalic . Face symmetric, atraumatic Lungs:  CTA B Normal respiratory effort, no intercostal retractions, no accessory muscle use. Heart: RRR,  no murmur.  No pretibial edema bilaterally  Skin: Not pale. Not jaundice  Neurologic:  alert & oriented X3.  Speech normal, gait appropriate for age and unassisted Psych--  Cognition and judgment appear intact.  Cooperative with normal attention span and concentration.  Behavior appropriate. No anxious or depressed appearing.      Assessment     Assessment (new patient 2014) HTN dx ~ 34 Anxiety ADHD Tobacco   PLAN: HTN: BP has been consistently elevated, admits to poor compliance to medication. At the ER in August was 176/113, e ENT was 161/111. Importance of keeping BP control to prevent CAD, strokes, CRI d/w pt . Plan: BMP, refill amlodipine, increase losartan to 100 mg, monitor BPs.  Compliance strongly encouraged. STD check: Patient is sexually active, not using condoms consistently, he is asymptomatic, he is educated about safe sex, check HIV, RPR and G&C. Preventive care: Flu shot today RTC CPX 2 to 3 weeks

## 2018-12-21 NOTE — Progress Notes (Signed)
Pre visit review using our clinic review tool, if applicable. No additional management support is needed unless otherwise documented below in the visit note. 

## 2018-12-21 NOTE — Patient Instructions (Signed)
GO TO THE LAB : Get the blood work     GO TO THE FRONT DESK Schedule your next appointment  For a physical in 2-3 weeks       Check the  blood pressure 2 or 3 times a   week   BP GOAL is between 110/65 and  135/85. If it is consistently higher or lower, let me know   Safe Sex Practicing safe sex means taking steps before and during sex to reduce your risk of:  Getting an STI (sexually transmitted infection).  Giving your partner an STI.  Unwanted or unplanned pregnancy. How can I practice safe sex?     Ways you can practice safe sex  Limit your sexual partners to only one partner who is having sex with only you.  Avoid using alcohol and drugs before having sex. Alcohol and drugs can affect your judgment.  Before having sex with a new partner: ? Talk to your partner about past partners, past STIs, and drug use. ? Get screened for STIs and discuss the results with your partner. Ask your partner to get screened, too.  Check your body regularly for sores, blisters, rashes, or unusual discharge. If you notice any of these problems, visit your health care provider.  Avoid sexual contact if you have symptoms of an infection or you are being treated for an STI.  While having sex, use a condom. Make sure to: ? Use a condom every time you have vaginal, oral, or anal sex. Both females and males should wear condoms during oral sex. ? Keep condoms in place from the beginning to the end of sexual activity. ? Use a latex condom, if possible. Latex condoms offer the best protection. ? Use only water-based lubricants with a condom. Using petroleum-based lubricants or oils will weaken the condom and increase the chance that it will break. Ways your health care provider can help you practice safe sex  See your health care provider for regular screenings, exams, and tests for STIs.  Talk with your health care provider about what kind of birth control (contraception) is best for you.  Get  vaccinated against hepatitis B and human papillomavirus (HPV).  If you are at risk of being infected with HIV (human immunodeficiency virus), talk with your health care provider about taking a prescription medicine to prevent HIV infection. You are at risk for HIV if you: ? Are a man who has sex with other men. ? Are sexually active with more than one partner. ? Take drugs by injection. ? Have a sex partner who has HIV. ? Have unprotected sex. ? Have sex with someone who has sex with both men and women. ? Have had an STI. Follow these instructions at home:  Take over-the-counter and prescription medicines as told by your health care provider.  Keep all follow-up visits as told by your health care provider. This is important. Where to find more information  Centers for Disease Control and Prevention: LessFurniture.be  Planned Parenthood: https://www.plannedparenthood.org/  Office on Women's Health: EmploymentTracking.tn Summary  Practicing safe sex means taking steps before and during sex to reduce your risk of STIs, giving your partner STIs, and having an unwanted or unplanned pregnancy.  Before having sex with a new partner, talk to your partner about past partners, past STIs, and drug use.  Use a condom every time you have vaginal, oral, or anal sex. Both females and males should wear condoms during oral sex.  Check your body regularly for  sores, blisters, rashes, or unusual discharge. If you notice any of these problems, visit your health care provider.  See your health care provider for regular screenings, exams, and tests for STIs. This information is not intended to replace advice given to you by your health care provider. Make sure you discuss any questions you have with your health care provider. Document Released: 04/21/2004 Document Revised: 07/06/2018 Document Reviewed: 12/25/2017 Elsevier  Patient Education  2020 [employer].

## 2018-12-22 LAB — URINE CYTOLOGY ANCILLARY ONLY
Chlamydia: NEGATIVE
Neisseria Gonorrhea: NEGATIVE
Trichomonas: NEGATIVE

## 2018-12-22 LAB — BASIC METABOLIC PANEL
BUN: 19 mg/dL (ref 7–25)
CO2: 23 mmol/L (ref 20–32)
Calcium: 9.4 mg/dL (ref 8.6–10.3)
Chloride: 103 mmol/L (ref 98–110)
Creat: 0.65 mg/dL (ref 0.60–1.35)
Glucose, Bld: 92 mg/dL (ref 65–99)
Potassium: 4.6 mmol/L (ref 3.5–5.3)
Sodium: 138 mmol/L (ref 135–146)

## 2018-12-22 NOTE — Assessment & Plan Note (Signed)
HTN: BP has been consistently elevated, admits to poor compliance to medication. At the ER in August was 176/113, e ENT was 161/111. Importance of keeping BP control to prevent CAD, strokes, CRI d/w pt . Plan: BMP, refill amlodipine, increase losartan to 100 mg, monitor BPs.  Compliance strongly encouraged. STD check: Patient is sexually active, not using condoms consistently, he is asymptomatic, he is educated about safe sex, check HIV, RPR and G&C. Preventive care: Flu shot today RTC CPX 2 to 3 weeks

## 2018-12-24 ENCOUNTER — Encounter: Payer: Self-pay | Admitting: Internal Medicine

## 2018-12-24 LAB — HIV ANTIBODY (ROUTINE TESTING W REFLEX): HIV 1&2 Ab, 4th Generation: NONREACTIVE

## 2018-12-24 LAB — RPR: RPR Ser Ql: NONREACTIVE

## 2018-12-25 ENCOUNTER — Other Ambulatory Visit: Payer: Self-pay | Admitting: Internal Medicine

## 2018-12-25 MED ORDER — NYSTATIN-TRIAMCINOLONE 100000-0.1 UNIT/GM-% EX CREA: 1.0000 "application " | TOPICAL_CREAM | Freq: Two times a day (BID) | CUTANEOUS | 0 refills | Status: DC

## 2018-12-25 NOTE — Telephone Encounter (Signed)
Okay to refill x1 

## 2018-12-25 NOTE — Telephone Encounter (Signed)
Refill request for Mycolog II cream. Last filled 06/2017.

## 2019-01-23 ENCOUNTER — Encounter: Payer: BC Managed Care – PPO | Admitting: Internal Medicine

## 2019-02-04 ENCOUNTER — Ambulatory Visit (INDEPENDENT_AMBULATORY_CARE_PROVIDER_SITE_OTHER): Payer: BC Managed Care – PPO | Admitting: Internal Medicine

## 2019-02-04 ENCOUNTER — Encounter: Payer: Self-pay | Admitting: Internal Medicine

## 2019-02-04 ENCOUNTER — Other Ambulatory Visit: Payer: Self-pay

## 2019-02-04 VITALS — BP 160/80 | HR 90 | Temp 97.3°F | Resp 16 | Ht 68.0 in | Wt 255.1 lb

## 2019-02-04 DIAGNOSIS — B078 Other viral warts: Secondary | ICD-10-CM

## 2019-02-04 DIAGNOSIS — Z0000 Encounter for general adult medical examination without abnormal findings: Secondary | ICD-10-CM | POA: Diagnosis not present

## 2019-02-04 LAB — COMPREHENSIVE METABOLIC PANEL
ALT: 16 U/L (ref 0–53)
AST: 13 U/L (ref 0–37)
Albumin: 4.5 g/dL (ref 3.5–5.2)
Alkaline Phosphatase: 70 U/L (ref 39–117)
BUN: 17 mg/dL (ref 6–23)
CO2: 31 mEq/L (ref 19–32)
Calcium: 9 mg/dL (ref 8.4–10.5)
Chloride: 102 mEq/L (ref 96–112)
Creatinine, Ser: 0.66 mg/dL (ref 0.40–1.50)
GFR: 133.38 mL/min (ref >60.00)
Glucose, Bld: 97 mg/dL (ref 70–99)
Potassium: 4.2 mEq/L (ref 3.5–5.1)
Sodium: 141 mEq/L (ref 135–145)
Total Bilirubin: 0.3 mg/dL (ref 0.2–1.2)
Total Protein: 7.1 g/dL (ref 6.0–8.3)

## 2019-02-04 LAB — CBC WITH DIFFERENTIAL/PLATELET
Basophils Absolute: 0 10*3/uL (ref 0.0–0.1)
Basophils Relative: 0.5 % (ref 0.0–3.0)
Eosinophils Absolute: 0.2 10*3/uL (ref 0.0–0.7)
Eosinophils Relative: 1.9 % (ref 0.0–5.0)
HCT: 45.4 % (ref 39.0–52.0)
Hemoglobin: 15.1 g/dL (ref 13.0–17.0)
Lymphocytes Relative: 22.2 % (ref 12.0–46.0)
Lymphs Abs: 1.8 10*3/uL (ref 0.7–4.0)
MCHC: 33.4 g/dL (ref 30.0–36.0)
MCV: 91.8 fl (ref 78.0–100.0)
Monocytes Absolute: 0.6 10*3/uL (ref 0.1–1.0)
Monocytes Relative: 7.5 % (ref 3.0–12.0)
Neutro Abs: 5.6 10*3/uL (ref 1.4–7.7)
Neutrophils Relative %: 67.9 % (ref 43.0–77.0)
Platelets: 243 10*3/uL (ref 150.0–400.0)
RBC: 4.94 Mil/uL (ref 4.22–5.81)
RDW: 14.5 % (ref 11.5–15.5)
WBC: 8.3 10*3/uL (ref 4.0–10.5)

## 2019-02-04 LAB — LIPID PANEL
Cholesterol: 179 mg/dL (ref 0–200)
HDL: 62.6 mg/dL (ref >39.00)
LDL Cholesterol: 105 mg/dL — ABNORMAL HIGH (ref 0–99)
NonHDL: 115.91
Total CHOL/HDL Ratio: 3
Triglycerides: 53 mg/dL (ref 0.0–149.0)
VLDL: 10.6 mg/dL (ref 0.0–40.0)

## 2019-02-04 LAB — TSH: TSH: 1.53 u[IU]/mL (ref 0.35–4.50)

## 2019-02-04 NOTE — Progress Notes (Signed)
Subjective:    Patient ID: Mathew Daniels, male    DOB: 1981/03/28, 38 y.o.   MRN: [id number]  DOS:  02/04/2019 Type of visit - description: Here for CPX In general feeling well. BP today is elevated, reports that he just got a speeding ticket coming to our office, his obviously upset. Med compliance still not good. Lifestyle has no change   Review of Systems  Other than above, a 14 point review of systems is negative    Past Medical History:  Diagnosis Date  . ADD   . Bronchospasm   . Environmental allergies   . H/O: depression   . HTN     Past Surgical History:  Procedure Laterality Date  . TENDON REPAIR Right 1998   ankle    Social History   Socioeconomic History  . Marital status: Single    Spouse name: Not on file  . Number of children: 1  . Years of education: Not on file  . Highest education level: Not on file  Occupational History  . Occupation: finances   Social Needs  . Financial resource strain: Not on file  . Food insecurity    Worry: Not on file    Inability: Not on file  . Transportation needs    Medical: Not on file    Non-medical: Not on file  Tobacco Use  . Smoking status: Former Research scientist (life sciences)  . Smokeless tobacco: Former Systems developer  . Tobacco comment: quit smoking 2012, quit chewing 2014  Substance and Sexual Activity  . Alcohol use: Yes    Comment: socially  . Drug use: No  . Sexual activity: Not on file  Lifestyle  . Physical activity    Days per week: Not on file    Minutes per session: Not on file  . Stress: Not on file  Relationships  . Social Herbalist on phone: Not on file    Gets together: Not on file    Attends religious service: Not on file    Active member of club or organization: Not on file    Attends meetings of clubs or organizations: Not on file    Relationship status: Not on file  . Intimate partner violence    Fear of current or ex partner: Not on file    Emotionally abused: Not on file    Physically  abused: Not on file    Forced sexual activity: Not on file  Other Topics Concern  . Not on file  Social History Narrative   Divorced 2017   Lives w/ room mate    1 daughter born 2012 , 2 step kids                 Family History  Problem Relation Age of Onset  . CAD Father 10       MI x 2 CABG  . Diabetes Father   . Stroke Father   . CAD Other        GF MI  . Breast cancer Mother   . Colon cancer Neg Hx   . Cancer - Prostate Neg Hx      Allergies as of 02/04/2019      Reactions   Amoxicillin    Blisters feet hand July 2013, took amoxicillin before w/o problems   Penicillins Other (See Comments)   Blisters feet hand July 2013, took amoxicillin before w/o problems Blisters feet hand July 2013, took amoxicillin before w/o problems Peeling on feet  Medication List       Accurate as of February 04, 2019 11:59 PM. If you have any questions, ask your nurse or doctor.        amLODipine 10 MG tablet Commonly known as: NORVASC Take 1 tablet (10 mg total) by mouth daily.   clonazePAM 0.5 MG tablet Commonly known as: KLONOPIN Take 1 tablet (0.5 mg total) by mouth 2 (two) times daily as needed for anxiety.   losartan 100 MG tablet Commonly known as: COZAAR Take 1 tablet (100 mg total) by mouth daily.   multivitamin tablet Take 1 tablet by mouth daily. Optimen   nystatin-triamcinolone cream Commonly known as: MYCOLOG II Apply 1 application topically 2 (two) times daily.           Objective:   Physical Exam BP (!) 160/80   Pulse 90   Temp (!) 97.3 F (36.3 C) (Temporal)   Resp 16   Ht 5\' 8"  (1.727 m)   Wt 255 lb 2 oz (115.7 kg)   SpO2 100%   BMI 38.79 kg/m  General: Well developed, NAD, BMI noted Neck: No  thyromegaly  HEENT:  Normocephalic . Face symmetric, atraumatic Lungs:  CTA B Normal respiratory effort, no intercostal retractions, no accessory muscle use. Heart: RRR,  no murmur.  No pretibial edema bilaterally  Abdomen:  Not  distended, soft, non-tender. No rebound or rigidity.   Skin: Warty-like lesion near the right elbow Neurologic:  alert & oriented X3.  Speech normal, gait appropriate for age and unassisted Strength symmetric and appropriate for age.  Psych: Cognition and judgment appear intact.  Cooperative with normal attention span and concentration.  Behavior appropriate. No anxious or depressed appearing.     Assessment     Assessment   HTN dx ~ 34 Anxiety ADHD Tobacco   PLAN: Here for CPX HTN: On amlodipine and losartan, take it on average 5 times a week.  Ambulatory BPs when checked 130/90.  Today BP is elevated, he just got a speeding ticket coming to the office and is upset . Again discussed the risk of CAD, stroke with poorly controlled HTN. We talk about possibly adjusting medication but again we agreed to good compliance with meds, change lifestyle, come back in 2 months. Statins? I also discussed the fact that due to FH CAD, HTN and obesity we will have a low threshold to start cholesterol medication Wart: Wart on the elbow, self treated x4 over the last 3 months, no better.  Refer to Derm RTC 2 months

## 2019-02-04 NOTE — Patient Instructions (Addendum)
GO TO THE LAB : Get the blood work     GO TO THE FRONT DESK Schedule your next appointment   For a check up in 2 months    Check the  blood pressure 2 or 3 times a   week   BP GOAL is between 110/65 and  135/85. If it is consistently higher or lower, let me know   GENERAL INFORMATION ABOUT HEALTHY EATING, CHOLESTEROL, HIGH BLOOD PRESSURE, QUIT TOBACCO (if you smoke):  The American Heart Association, www.heart.org  The American diabetes Association www.diabetes.org  "[facility] [place]" book

## 2019-02-04 NOTE — Progress Notes (Signed)
Pre visit review using our clinic review tool, if applicable. No additional management support is needed unless otherwise documented below in the visit note. 

## 2019-02-05 NOTE — Assessment & Plan Note (Signed)
-   Tdap 2014 -Had a flu shot -Lifestyle discussed including exercise regularly, low-salt diet, weight loss. -Labs: CMP, CBC, FLP, TSH

## 2019-02-05 NOTE — Assessment & Plan Note (Addendum)
Here for CPX HTN: On amlodipine and losartan, take it on average 5 times a week.  Ambulatory BPs when checked 130/90.  Today BP is elevated, he just got a speeding ticket coming to the office and is upset . Again discussed the risk of CAD, stroke with poorly controlled HTN. We talk about possibly adjusting medication but again we agreed to good compliance with meds, change lifestyle, come back in 2 months. Statins? I also discussed the fact that due to [place] CAD, HTN and obesity we will have a low threshold to start cholesterol medication Wart: Wart on the elbow, self treated x4 over the last 3 months, no better.  Refer to Derm RTC 2 months

## 2019-02-11 DIAGNOSIS — D485 Neoplasm of uncertain behavior of skin: Secondary | ICD-10-CM | POA: Diagnosis not present

## 2019-02-11 DIAGNOSIS — L720 Epidermal cyst: Secondary | ICD-10-CM | POA: Diagnosis not present

## 2019-02-11 DIAGNOSIS — B079 Viral wart, unspecified: Secondary | ICD-10-CM | POA: Diagnosis not present

## 2019-03-19 ENCOUNTER — Other Ambulatory Visit: Payer: Self-pay | Admitting: Internal Medicine

## 2019-04-02 ENCOUNTER — Ambulatory Visit: Payer: BC Managed Care – PPO | Admitting: Internal Medicine

## 2019-04-19 ENCOUNTER — Ambulatory Visit: Payer: Self-pay | Admitting: Internal Medicine

## 2019-05-02 ENCOUNTER — Ambulatory Visit: Payer: Self-pay | Admitting: Internal Medicine

## 2019-06-18 ENCOUNTER — Encounter: Payer: Self-pay | Admitting: Internal Medicine

## 2019-06-18 ENCOUNTER — Other Ambulatory Visit: Payer: Self-pay

## 2019-06-18 ENCOUNTER — Ambulatory Visit (INDEPENDENT_AMBULATORY_CARE_PROVIDER_SITE_OTHER): Payer: BC Managed Care – PPO | Admitting: Internal Medicine

## 2019-06-18 VITALS — BP 162/91 | HR 79 | Temp 98.1°F | Resp 18 | Ht 68.0 in | Wt 263.2 lb

## 2019-06-18 DIAGNOSIS — I10 Essential (primary) hypertension: Secondary | ICD-10-CM | POA: Diagnosis not present

## 2019-06-18 DIAGNOSIS — Z8249 Family history of ischemic heart disease and other diseases of the circulatory system: Secondary | ICD-10-CM

## 2019-06-18 DIAGNOSIS — F419 Anxiety disorder, unspecified: Secondary | ICD-10-CM

## 2019-06-18 DIAGNOSIS — R079 Chest pain, unspecified: Secondary | ICD-10-CM | POA: Diagnosis not present

## 2019-06-18 LAB — CBC WITH DIFFERENTIAL/PLATELET
Basophils Absolute: 0 10*3/uL (ref 0.0–0.1)
Basophils Relative: 0.4 % (ref 0.0–3.0)
Eosinophils Absolute: 0.1 10*3/uL (ref 0.0–0.7)
Eosinophils Relative: 0.7 % (ref 0.0–5.0)
HCT: 45.5 % (ref 39.0–52.0)
Hemoglobin: 15.2 g/dL (ref 13.0–17.0)
Lymphocytes Relative: 15.3 % (ref 12.0–46.0)
Lymphs Abs: 1.5 10*3/uL (ref 0.7–4.0)
MCHC: 33.4 g/dL (ref 30.0–36.0)
MCV: 92.5 fl (ref 78.0–100.0)
Monocytes Absolute: 0.5 10*3/uL (ref 0.1–1.0)
Monocytes Relative: 5.4 % (ref 3.0–12.0)
Neutro Abs: 7.6 10*3/uL (ref 1.4–7.7)
Neutrophils Relative %: 78.2 % — ABNORMAL HIGH (ref 43.0–77.0)
Platelets: 230 10*3/uL (ref 150.0–400.0)
RBC: 4.92 Mil/uL (ref 4.22–5.81)
RDW: 14.2 % (ref 11.5–15.5)
WBC: 9.7 10*3/uL (ref 4.0–10.5)

## 2019-06-18 LAB — COMPREHENSIVE METABOLIC PANEL
ALT: 22 U/L (ref 0–53)
AST: 16 U/L (ref 0–37)
Albumin: 4.3 g/dL (ref 3.5–5.2)
Alkaline Phosphatase: 75 U/L (ref 39–117)
BUN: 19 mg/dL (ref 6–23)
CO2: 31 mEq/L (ref 19–32)
Calcium: 9.3 mg/dL (ref 8.4–10.5)
Chloride: 102 mEq/L (ref 96–112)
Creatinine, Ser: 0.72 mg/dL (ref 0.40–1.50)
GFR: 120.42 mL/min (ref >60.00)
Glucose, Bld: 99 mg/dL (ref 70–99)
Potassium: 4.4 mEq/L (ref 3.5–5.1)
Sodium: 137 mEq/L (ref 135–145)
Total Bilirubin: 0.4 mg/dL (ref 0.2–1.2)
Total Protein: 6.8 g/dL (ref 6.0–8.3)

## 2019-06-18 MED ORDER — CARVEDILOL 12.5 MG PO TABS: 12.5000 mg | ORAL_TABLET | Freq: Two times a day (BID) | ORAL | 2 refills | Status: DC

## 2019-06-18 MED ORDER — PANTOPRAZOLE SODIUM 40 MG PO TBEC: 40.0000 mg | DELAYED_RELEASE_TABLET | Freq: Every day | ORAL | 2 refills | Status: DC

## 2019-06-18 NOTE — Progress Notes (Signed)
Subjective:    Patient ID: Mathew Daniels, male    DOB: June 21, 1980, 39 y.o.   MRN: [id number]  DOS:  06/18/2019 Type of visit - description: Acute 2 weeks history of chest discomfort, "no pain". It is located in the mid anterior chest, no radiation, typically postprandial, last 2 to 3 hours. No radiation No exertional.  HTN typically well controlled in the 120s, in the last 2 weeks has been in the 160s.  Admits to a lot of stress at work, he is overeating, over drinking and started smoking again.  + Snoring    Wt Readings from Last 3 Encounters:  06/18/19 263 lb 4 oz (119.4 kg)  02/04/19 255 lb 2 oz (115.7 kg)  12/21/18 256 lb 4 oz (116.2 kg)     Review of Systems Denies fever chills No DOE No lower extremity edema No nausea, vomiting, blood in the stools.  No dysphagia or odynophagia   Past Medical History:  Diagnosis Date  . ADD   . Bronchospasm   . Environmental allergies   . H/O: depression   . HTN     Past Surgical History:  Procedure Laterality Date  . TENDON REPAIR Right 1998   ankle    Allergies as of 06/18/2019      Reactions   Amoxicillin    Blisters feet hand July 2013, took amoxicillin before w/o problems   Penicillins Other (See Comments)   Blisters feet hand July 2013, took amoxicillin before w/o problems Blisters feet hand July 2013, took amoxicillin before w/o problems Peeling on feet      Medication List       Accurate as of June 18, 2019  7:30 PM. If you have any questions, ask your nurse or doctor.        STOP taking these medications   nystatin-triamcinolone cream Commonly known as: MYCOLOG II Stopped by: Willow Ora, MD     TAKE these medications   amLODipine 10 MG tablet Commonly known as: NORVASC Take 1 tablet (10 mg total) by mouth daily.   carvedilol 12.5 MG tablet Commonly known as: COREG Take 1 tablet (12.5 mg total) by mouth 2 (two) times daily with a meal. Started by: Willow Ora, MD   clonazePAM 0.5 MG tablet  Commonly known as: KLONOPIN Take 1 tablet (0.5 mg total) by mouth 2 (two) times daily as needed for anxiety.   losartan 100 MG tablet Commonly known as: COZAAR Take 1 tablet (100 mg total) by mouth daily.   multivitamin tablet Take 1 tablet by mouth daily. Optimen   pantoprazole 40 MG tablet Commonly known as: PROTONIX Take 1 tablet (40 mg total) by mouth daily before breakfast. Started by: Willow Ora, MD          Objective:   Physical Exam BP (!) 162/91 (BP Location: Left Arm, Patient Position: Sitting, Cuff Size: Normal)   Pulse 79   Temp 98.1 F (36.7 C) (Temporal)   Resp 18   Ht 5\' 8"  (1.727 m)   Wt 263 lb 4 oz (119.4 kg)   SpO2 97%   BMI 40.03 kg/m  General:   Well developed, NAD, BMI noted.  HEENT:  Normocephalic . Face symmetric, atraumatic Lungs:  CTA B Normal respiratory effort, no intercostal retractions, no accessory muscle use. Heart: RRR,  no murmur.  Abdomen:  Not distended, soft, non-tender. No rebound or rigidity.   Skin: Not pale. Not jaundice Lower extremities: no pretibial edema bilaterally  Neurologic:  alert & oriented  X3.  Speech normal, gait appropriate for age and unassisted Psych--  Cognition and judgment appear intact.  Cooperative with normal attention span and concentration.  Behavior appropriate. Apprehensive but not depressed appearing.     Assessment     Assessment   HTN dx ~ 34 Anxiety ADHD Tobacco   PLAN: Chest pain: Atypical features, postprandial, not exertional, EKG today: NSR. We will treat empirically with Protonix and assess further his cardiovascular risk factor.  See next CV RF: His 10-year cardiovascular risk is 3.8% however he is overweight, inactive, has a + FH (father, CABG in his 84s), also has high blood pressure.  Not on statins due to low CV risk of 3.8%. At this  point I think he will benefit from coronary calcium score versus a stress test.  Will refer to cardiology. HTN: Well-controlled until recently  on losartan 100 mg, amlodipine 10 mg daily.  Will add carvedilol 12.5 mg, 1 p.o. twice daily.  Monitor BPs.  Check a CMP and CBC Anxiety: Very stressed due to work for the last 3 months, reports overeating, drinking too much wine at night.  He restarted tobacco temporarily, quit again 2 weeks ago.  Recommend counseling but states he does not think that will work for him.  Will reassess on RTC.   Snoring: epworth test negative, re assess on RTC RTC 4 week     This visit occurred during the SARS-CoV-2 public health emergency.  Safety protocols were in place, including screening questions prior to the visit, additional usage of staff PPE, and extensive cleaning of exam room while observing appropriate contact time as indicated for disinfecting solutions.

## 2019-06-18 NOTE — Assessment & Plan Note (Signed)
Chest pain: Atypical features, postprandial, not exertional, EKG today: NSR. We will treat empirically with Protonix and assess further his cardiovascular risk factor.  See next CV RF: His 10-year cardiovascular risk is 3.8% however he is overweight, inactive, has a + FH (father, CABG in his 59s), also has high blood pressure.  Not on statins due to low CV risk of 3.8%. At this  point I think he will benefit from coronary calcium score versus a stress test.  Will refer to cardiology. HTN: Well-controlled until recently on losartan 100 mg, amlodipine 10 mg daily.  Will add carvedilol 12.5 mg, 1 p.o. twice daily.  Monitor BPs.  Check a CMP and CBC Anxiety: Very stressed due to work for the last 3 months, reports overeating, drinking too much wine at night.  He restarted tobacco temporarily, quit again 2 weeks ago.  Recommend counseling but states he does not think that will work for him.  Will reassess on RTC.   Snoring: epworth test negative, re assess on RTC RTC 4 week

## 2019-06-18 NOTE — Patient Instructions (Signed)
For high blood pressure: We will add carvedilol 12.5 mg 1 tablet twice a day  Check the  blood pressure 2 or 3 times a week BP GOAL is between 110/65 and  135/85. If it is consistently higher or lower, let me know   For chest pain: Start pantoprazole 40 mg 1 tablet before breakfast.  We are referring you to a cardiologist  If you have any unusual or severe chest pain: Call 911 or go to the ER   GO TO THE LAB : Get the blood work     GO TO THE FRONT DESK, please reschedule your appointments Come back for   a checkup in 4 weeks

## 2019-06-18 NOTE — Progress Notes (Signed)
Pre visit review using our clinic review tool, if applicable. No additional management support is needed unless otherwise documented below in the visit note. 

## 2019-06-20 ENCOUNTER — Encounter: Payer: Self-pay | Admitting: Cardiology

## 2019-06-20 ENCOUNTER — Ambulatory Visit (INDEPENDENT_AMBULATORY_CARE_PROVIDER_SITE_OTHER): Payer: BC Managed Care – PPO | Admitting: Cardiology

## 2019-06-20 ENCOUNTER — Other Ambulatory Visit: Payer: Self-pay

## 2019-06-20 VITALS — BP 154/76 | HR 76 | Temp 98.2°F | Ht 66.0 in | Wt 264.1 lb

## 2019-06-20 DIAGNOSIS — R0789 Other chest pain: Secondary | ICD-10-CM | POA: Diagnosis not present

## 2019-06-20 DIAGNOSIS — R0600 Dyspnea, unspecified: Secondary | ICD-10-CM | POA: Diagnosis not present

## 2019-06-20 DIAGNOSIS — R0609 Other forms of dyspnea: Secondary | ICD-10-CM

## 2019-06-20 DIAGNOSIS — I10 Essential (primary) hypertension: Secondary | ICD-10-CM | POA: Diagnosis not present

## 2019-06-20 DIAGNOSIS — E785 Hyperlipidemia, unspecified: Secondary | ICD-10-CM

## 2019-06-20 DIAGNOSIS — R079 Chest pain, unspecified: Secondary | ICD-10-CM

## 2019-06-20 HISTORY — DX: Hyperlipidemia, unspecified: E78.5

## 2019-06-20 HISTORY — DX: Dyspnea, unspecified: R06.00

## 2019-06-20 HISTORY — DX: Other forms of dyspnea: R06.09

## 2019-06-20 HISTORY — DX: Other chest pain: R07.89

## 2019-06-20 NOTE — Addendum Note (Signed)
Addended by: Lita Mains on: 06/20/2019 09:54 AM   Modules accepted: Orders

## 2019-06-20 NOTE — Patient Instructions (Signed)
Medication Instructions:  Your physician recommends that you continue on your current medications as directed. Please refer to the Current Medication list given to you today.  *If you need a refill on your cardiac medications before your next appointment, please call your pharmacy*   Lab Work: None.  If you have labs (blood work) drawn today and your tests are completely normal, you will receive your results only by: Marland Kitchen MyChart Message (if you have MyChart) OR . A paper copy in the mail If you have any lab test that is abnormal or we need to change your treatment, we will call you to review the results.   Testing/Procedures: Non-Cardiac CT scanning, (CAT scanning), is a noninvasive, special x-ray that produces cross-sectional images of the body using x-rays and a computer. CT scans help physicians diagnose and treat medical conditions. For some CT exams, a contrast material is used to enhance visibility in the area of the body being studied. CT scans provide greater clarity and reveal more details than regular x-ray exams.     Follow-Up: At [facility], you and your health needs are our priority.  As part of our continuing mission to provide you with exceptional heart care, we have created designated Provider Care Teams.  These Care Teams include your primary Cardiologist (physician) and Advanced Practice Providers (APPs -  Physician Assistants and Nurse Practitioners) who all work together to provide you with the care you need, when you need it.  We recommend signing up for the patient portal called "MyChart".  Sign up information is provided on this After Visit Summary.  MyChart is used to connect with patients for Virtual Visits (Telemedicine).  Patients are able to view lab/test results, encounter notes, upcoming appointments, etc.  Non-urgent messages can be sent to your provider as well.   To learn more about what you can do with MyChart, go to [email or web address].    Your next  appointment:   6 week(s)  The format for your next appointment:   In Person  Provider:   Gypsy Balsam, MD   Other Instructions

## 2019-06-20 NOTE — Progress Notes (Signed)
Cardiology Consultation:    Date:  06/20/2019   ID:  Mathew Daniels, DOB 13-Sep-1980, MRN [id number]  PCP:  Wanda Plump, MD  Cardiologist:  Gypsy Balsam, MD   Referring MD: Wanda Plump, MD   No chief complaint on file. Have a chest pain  History of Present Illness:    Mathew Daniels is a 38 y.o. male who is being seen today for the evaluation of chest pain at the request of Wanda Plump, MD.  Past medical history significant for essential hypertension, dyslipidemia, sedentary lifestyle, family history of rheumatoid coronary artery disease.  He was referred to [place] because of atypical chest pain.  For about a month he experienced some strange burning-like sensation in the middle of the chest that usually happen at rest after he eats and when he lay down this sensation is worse.  Recently he was given proton pump inhibitor and he described the fact that he feels much better.  Those episodes are not that often.  The usual duration of the episodes is for few seconds to few minutes.  There was no shortness of breath no sweating associated with this sensation. He was a smoker however quit 3 weeks ago.  He also does have a high blood pressure that required 3 medications for management of this.  His cholesterol is elevated with latest LDL that I reviewed from [facility] being 105.  Luckily, his HDL is high meaning 62. He does not exercise on the regular basis however he used to.  Recently he want to change his life and he started walking on the regular basis and he does get shortness of breath with walking but no chest pain, tightness, pressure, burning in the chest. He is a Engineer, manufacturing, his job is very stressful and he described the fact that this is very stressful for him. Past Medical History:  Diagnosis Date  . ADD   . ADHD 03/24/2008   Qualifier: Diagnosis of  By: Drue Novel MD, Nolon Rod ALLERGIC RHINITIS 03/24/2008   Qualifier: Diagnosis of  By: Drue Novel MD, Jose E.   . Allergic rhinitis  08/01/2013  . Annual physical exam 06/26/2012  . Anxiety 03/24/2008   Qualifier: Diagnosis of  By: Drue Novel MD, Nolon Rod.   . Bronchospasm   . Environmental allergies   . Epistaxis 11/26/2018  . Essential hypertension 03/24/2008   Qualifier: Diagnosis of  By: Drue Novel MD, Nolon Rod   . H/O: depression   . HTN     Past Surgical History:  Procedure Laterality Date  . TENDON REPAIR Right 1998   ankle    Current Medications: Current Meds  Medication Sig  . amLODipine (NORVASC) 10 MG tablet Take 1 tablet (10 mg total) by mouth daily.  . carvedilol (COREG) 12.5 MG tablet Take 1 tablet (12.5 mg total) by mouth 2 (two) times daily with a meal.  . clonazePAM (KLONOPIN) 0.5 MG tablet Take 1 tablet (0.5 mg total) by mouth 2 (two) times daily as needed for anxiety.  Marland Kitchen losartan (COZAAR) 100 MG tablet Take 1 tablet (100 mg total) by mouth daily.  . Multiple Vitamin (MULTIVITAMIN) tablet Take 1 tablet by mouth daily. Optimen  . pantoprazole (PROTONIX) 40 MG tablet Take 1 tablet (40 mg total) by mouth daily before breakfast.     Allergies:   Amoxicillin and Penicillins   Social History   Socioeconomic History  . Marital status: Single    Spouse name: Not on file  .  Number of children: 1  . Years of education: Not on file  . Highest education level: Not on file  Occupational History  . Occupation: finances   Tobacco Use  . Smoking status: Former Research scientist (life sciences)  . Smokeless tobacco: Former Systems developer  . Tobacco comment: quit smoking 2012, quit chewing 2014  Substance and Sexual Activity  . Alcohol use: Yes    Comment: socially  . Drug use: No  . Sexual activity: Not on file  Other Topics Concern  . Not on file  Social History Narrative   Divorced 2017   Lives w/ room mate    1 daughter born 2012 , 2 step kids               Social Determinants of Health   Financial Resource Strain:   . Difficulty of Paying Living Expenses:   Food Insecurity:   . Worried About Charity fundraiser in the Last Year:   .  Arboriculturist in the Last Year:   Transportation Needs:   . Film/video editor (Medical):   Marland Kitchen Lack of Transportation (Non-Medical):   Physical Activity:   . Days of Exercise per Week:   . Minutes of Exercise per Session:   Stress:   . Feeling of Stress :   Social Connections:   . Frequency of Communication with Friends and Family:   . Frequency of Social Gatherings with Friends and Family:   . Attends Religious Services:   . Active Member of Clubs or Organizations:   . Attends Archivist Meetings:   Marland Kitchen Marital Status:      Family History: The patient's family history includes Breast cancer in his mother; CAD in an other family member; CAD (age of onset: 23) in his father; Diabetes in his father; Stroke in his father. There is no history of Colon cancer or Cancer - Prostate. ROS:   Please see the history of present illness.    All 14 point review of systems negative except as described per history of present illness.  EKGs/Labs/Other Studies Reviewed:    The following studies were reviewed today: EKG done by his primary care physician showed normal sinus rhythm, normal P interval, normal QS complex duration morphology.  I did review his laboratory test from [facility] with LDL being 105 and HDL of 62 from November 2020.  Creatinine was normal TSH was normal.    Recent Labs: 02/04/2019: TSH 1.53 06/18/2019: ALT 22; BUN 19; Creatinine, Ser 0.72; Hemoglobin 15.2; Platelets 230.0; Potassium 4.4; Sodium 137  Recent Lipid Panel    Component Value Date/Time   CHOL 179 02/04/2019 0913   TRIG 53.0 02/04/2019 0913   HDL 62.60 02/04/2019 0913   CHOLHDL 3 02/04/2019 0913   VLDL 10.6 02/04/2019 0913   LDLCALC 105 (H) 02/04/2019 0913    Physical Exam:    VS:  BP (!) 154/76   Pulse 76   Temp 98.2 F (36.8 C)   Ht 5\' 6"  (1.676 m)   Wt 264 lb 1.9 oz (119.8 kg)   SpO2 97%   BMI 42.63 kg/m     Wt Readings from Last 3 Encounters:  06/20/19 264 lb 1.9 oz (119.8 kg)   06/18/19 263 lb 4 oz (119.4 kg)  02/04/19 255 lb 2 oz (115.7 kg)     GEN:  Well nourished, well developed in no acute distress HEENT: Normal NECK: No JVD; No carotid bruits LYMPHATICS: No lymphadenopathy CARDIAC: RRR, no murmurs, no rubs, no  gallops RESPIRATORY:  Clear to auscultation without rales, wheezing or rhonchi  ABDOMEN: Soft, non-tender, non-distended MUSCULOSKELETAL:  No edema; No deformity  SKIN: Warm and dry NEUROLOGIC:  Alert and oriented x 3 PSYCHIATRIC:  Normal affect   ASSESSMENT:    1. Essential hypertension   2. Atypical chest pain   3. Dyslipidemia   4. Dyspnea on exertion    PLAN:    In order of problems listed above:  1. Atypical chest pain.  Have really have low level suspicion that this is cardiac complaint.  It is better with proton pump inhibitor, not related to exercise.  Typically happen after eating something.  I think this is most likely GI issue.  However I think it would be reasonable to perform calcium score on him this is mostly for prognostic reasons rather than for diagnosis of coronary artery disease which I do not think he got any active problem going on right now.  That will help [place] to stratify him and determine how aggressive we need to be with risk factors modifications.  I spent on right great left time talking to him about the need to exercise on the regular basis need to lose weight we did discuss the basic of Mediterranean diet. 2. Essential hypertension, his blood pressure is elevated today.  I asked him to check his blood pressure on the regular basis once a day different days at different times of the day, and bring results of those test to me. 3. Snoring: I suspect he does have some sleep apnea.  We started talking about potentially doing a sleep study, however, he prefers to work on his weight which I think is a very reasonable approach and then revisit that issue. 4. Dyspnea on exertion multifactorial.  I think the fact that he is  deconditioned play some role here.  He started exercising on a regular basis he also ordered some rowing machine.  I encouraged him to do that and we will see how things will play out.  Overall he is a gentleman with multiple risk factors for coronary artery disease he does have hypertension.  He recently quit smoking which was 3 weeks ago which I congratulated him for and encouraged him to stay away from it.  He also got a family history of premature coronary artery disease.  Hypertension.  We need to consider risk factors modification his symptomatology that he presented with today to my office is very atypical and I doubt this is coronary event.  I think doing calcium score and is 39 years old gentleman will be beneficial because it will encourage him to be more active if he does have some calcifications.  I am doing this mostly from prognostic point review.   Medication Adjustments/Labs and Tests Ordered: Current medicines are reviewed at length with the patient today.  Concerns regarding medicines are outlined above.  No orders of the defined types were placed in this encounter.  No orders of the defined types were placed in this encounter.   Signed, Georgeanna Lea, MD, [facility]. 06/20/2019 9:41 AM    [facility]

## 2019-06-21 ENCOUNTER — Encounter: Payer: Self-pay | Admitting: Internal Medicine

## 2019-06-28 ENCOUNTER — Ambulatory Visit (INDEPENDENT_AMBULATORY_CARE_PROVIDER_SITE_OTHER)
Admission: RE | Admit: 2019-06-28 | Discharge: 2019-06-28 | Disposition: A | Discharge status: Home / Self Care | Payer: Self-pay | Source: Ambulatory Visit | Attending: Cardiology | Admitting: Cardiology

## 2019-06-28 ENCOUNTER — Other Ambulatory Visit: Payer: Self-pay

## 2019-06-28 DIAGNOSIS — R079 Chest pain, unspecified: Secondary | ICD-10-CM

## 2019-07-16 ENCOUNTER — Ambulatory Visit: Payer: BC Managed Care – PPO | Admitting: Internal Medicine

## 2019-07-17 ENCOUNTER — Ambulatory Visit: Payer: BC Managed Care – PPO | Admitting: Internal Medicine

## 2019-07-24 ENCOUNTER — Ambulatory Visit: Payer: BC Managed Care – PPO | Admitting: Internal Medicine

## 2019-07-31 ENCOUNTER — Other Ambulatory Visit: Payer: Self-pay

## 2019-07-31 ENCOUNTER — Encounter: Payer: Self-pay | Admitting: Internal Medicine

## 2019-07-31 ENCOUNTER — Ambulatory Visit (INDEPENDENT_AMBULATORY_CARE_PROVIDER_SITE_OTHER): Payer: BC Managed Care – PPO | Admitting: Internal Medicine

## 2019-07-31 VITALS — BP 152/99 | HR 88 | Temp 98.2°F | Resp 16 | Ht 66.0 in | Wt 267.1 lb

## 2019-07-31 DIAGNOSIS — F419 Anxiety disorder, unspecified: Secondary | ICD-10-CM

## 2019-07-31 DIAGNOSIS — R0789 Other chest pain: Secondary | ICD-10-CM

## 2019-07-31 DIAGNOSIS — I10 Essential (primary) hypertension: Secondary | ICD-10-CM | POA: Diagnosis not present

## 2019-07-31 DIAGNOSIS — R0609 Other forms of dyspnea: Secondary | ICD-10-CM

## 2019-07-31 DIAGNOSIS — R0600 Dyspnea, unspecified: Secondary | ICD-10-CM | POA: Diagnosis not present

## 2019-07-31 MED ORDER — CARVEDILOL 25 MG PO TABS: 25.0000 mg | ORAL_TABLET | Freq: Two times a day (BID) | ORAL | 6 refills | Status: DC

## 2019-07-31 NOTE — Progress Notes (Signed)
Pre visit review using our clinic review tool, if applicable. No additional management support is needed unless otherwise documented below in the visit note. 

## 2019-07-31 NOTE — Patient Instructions (Signed)
Change carvedilol to 25 mg twice a day  All medications the same  Continue checking your blood pressures and call if not at goal. BP GOAL is between 110/65 and  135/85. If it is consistently higher or lower, let me know      GO TO THE FRONT DESK, PLEASE SCHEDULE YOUR APPOINTMENTS Come back for for a check up in 4 months

## 2019-07-31 NOTE — Progress Notes (Signed)
Subjective:    Patient ID: Mathew Daniels, male    DOB: [birth year], 39 y.o.   MRN: [id number]  DOS:  07/31/2019 Type of visit - description: Follow-up Today with talk about hypertension, anxiety, morbid obesity. Note from cardiology reviewed.    Review of Systems See last visit, no further chest pain since he started PPIs.   Past Medical History:  Diagnosis Date  . ADD   . ADHD 03/24/2008   Qualifier: Diagnosis of  By: Drue Novel MD, Nolon Rod ALLERGIC RHINITIS 03/24/2008   Qualifier: Diagnosis of  By: Drue Novel MD, Murad Staples E.   . Allergic rhinitis 08/01/2013  . Annual physical exam 06/26/2012  . Anxiety 03/24/2008   Qualifier: Diagnosis of  By: Drue Novel MD, Nolon Rod.   . Bronchospasm   . Environmental allergies   . Epistaxis 11/26/2018  . Essential hypertension 03/24/2008   Qualifier: Diagnosis of  By: Drue Novel MD, Nolon Rod   . H/O: depression   . HTN     Past Surgical History:  Procedure Laterality Date  . TENDON REPAIR Right 1998   ankle    Allergies as of 07/31/2019      Reactions   Amoxicillin    Blisters feet hand July 2013, took amoxicillin before w/o problems   Penicillins Other (See Comments)   Blisters feet hand July 2013, took amoxicillin before w/o problems Blisters feet hand July 2013, took amoxicillin before w/o problems Peeling on feet      Medication List       Accurate as of Jul 31, 2019 11:59 PM. If you have any questions, ask your nurse or doctor.        amLODipine 10 MG tablet Commonly known as: NORVASC Take 1 tablet (10 mg total) by mouth daily.   carvedilol 25 MG tablet Commonly known as: COREG Take 1 tablet (25 mg total) by mouth 2 (two) times daily with a meal. What changed:   medication strength  how much to take Changed by: Willow Ora, MD   clonazePAM 0.5 MG tablet Commonly known as: KLONOPIN Take 1 tablet (0.5 mg total) by mouth 2 (two) times daily as needed for anxiety.   losartan 100 MG tablet Commonly known as: COZAAR Take 1 tablet (100 mg  total) by mouth daily.   multivitamin tablet Take 1 tablet by mouth daily. Optimen   pantoprazole 40 MG tablet Commonly known as: PROTONIX Take 1 tablet (40 mg total) by mouth daily before breakfast.          Objective:   Physical Exam BP (!) 152/99 (BP Location: Left Arm, Patient Position: Sitting, Cuff Size: Normal)   Pulse 88   Temp 98.2 F (36.8 C) (Temporal)   Resp 16   Ht 5\' 6"  (1.676 m)   Wt 267 lb 2 oz (121.2 kg)   SpO2 94%   BMI 43.12 kg/m  General:   Well developed, NAD, BMI noted. HEENT:  Normocephalic . Face symmetric, atraumatic Neurologic:  alert & oriented X3.  Speech normal, gait appropriate for age and unassisted Psych--  Cognition and judgment appear intact.  Cooperative with normal attention span and concentration.  Behavior appropriate. Mildly stressed/anxious but not depressed appearing.      Assessment       Assessment   HTN dx ~ 34 Anxiety ADHD Tobacco  + FH CAD: Saw cardiology, coronary calcium score: 0 (05-2019)  PLAN: Chest pain: See last visit, started PPIs, chest pain resolved, now on PPIs as needed. Seen by  cardiology 06/20/2019, chart reviewed, they also felt that chest pain was atypical and likely GI. He was offered sleep study but pt declined. DOE felt to be multifactorial. Coronary calcium score 0. Plan: Continue PPIs as needed HTN: BP today is elevated, I rechecked on the right arm and again obtained the elevated reading: 180/110.  At home is typically no more than 130/80.  He reports that this morning was very stressful already. Plan: Continue amlodipine, losartan,  increase carvedilol to 25 mg twice daily, monitor BPs, see AVS, will call sooner than 4 months if BP not controlled. 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  For now he elected to stay on clonazepam which  he takes very seldom. Morbid obesity: BMI is 43, he already has plans to change his diet and increase physical activity.  Praised. Tobacco: Still smoking sporadically, counseled. RTC 4 months.   Time spent 32 minutes  This visit occurred during the SARS-CoV-2 public health emergency.  Safety protocols were in place, including screening questions prior to the visit, additional usage of staff PPE, and extensive cleaning of exam room while observing appropriate contact time as indicated for disinfecting solutions.

## 2019-08-01 HISTORY — DX: Morbid (severe) obesity due to excess calories: E66.01

## 2019-08-01 NOTE — Assessment & Plan Note (Signed)
Chest pain: See last visit, started PPIs, chest pain resolved, now on PPIs as needed. Seen by cardiology 06/20/2019, chart reviewed, they also felt that chest pain was atypical and likely GI. He was offered sleep study but pt declined. DOE felt to be multifactorial. Coronary calcium score 0. Plan: Continue PPIs as needed HTN: BP today is elevated, I rechecked on the right arm and again obtained the elevated reading: 180/110.  At home is typically no more than 130/80.  He reports that this morning was very stressful already. Plan: Continue amlodipine, losartan,  increase carvedilol to 25 mg twice daily, monitor BPs, see AVS, will call sooner than 4 months if BP not controlled. 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  For now he elected to stay on clonazepam which he takes very seldom. Morbid obesity: BMI is 43, he already has plans to change his diet and increase physical activity.  Praised. Tobacco: Still smoking sporadically, counseled. RTC 4 months.

## 2019-08-06 ENCOUNTER — Ambulatory Visit: Payer: BC Managed Care – PPO | Admitting: Cardiology

## 2019-08-07 ENCOUNTER — Ambulatory Visit: Payer: BC Managed Care – PPO | Admitting: Cardiology

## 2019-08-30 ENCOUNTER — Ambulatory Visit: Payer: BC Managed Care – PPO | Admitting: Cardiology

## 2019-09-14 ENCOUNTER — Telehealth: Payer: Self-pay | Admitting: Internal Medicine

## 2019-09-16 MED ORDER — CLONAZEPAM 0.5 MG PO TABS: 0.5000 mg | ORAL_TABLET | Freq: Two times a day (BID) | ORAL | 0 refills | Status: DC | PRN

## 2019-09-16 NOTE — Telephone Encounter (Signed)
Clonazepam refill.   Last OV: 07/31/2019, appt 12/03/2019 Last Fill: 06/12/2018 #15 and 0RF Pt sig: 1 tab bid prn UDS: None  Needs UDS and contract

## 2019-09-16 NOTE — Telephone Encounter (Signed)
PDMP okay, Rx sent 

## 2019-10-04 ENCOUNTER — Ambulatory Visit: Payer: BC Managed Care – PPO | Admitting: Cardiology

## 2019-10-22 ENCOUNTER — Other Ambulatory Visit: Payer: Self-pay | Admitting: Internal Medicine

## 2019-11-25 ENCOUNTER — Ambulatory Visit: Payer: BC Managed Care – PPO | Admitting: Cardiology

## 2019-12-03 ENCOUNTER — Ambulatory Visit: Payer: BC Managed Care – PPO | Admitting: Internal Medicine

## 2019-12-03 DIAGNOSIS — Z0289 Encounter for other administrative examinations: Secondary | ICD-10-CM

## 2019-12-15 DIAGNOSIS — Z20828 Contact with and (suspected) exposure to other viral communicable diseases: Secondary | ICD-10-CM | POA: Diagnosis not present

## 2020-02-17 ENCOUNTER — Other Ambulatory Visit: Payer: Self-pay | Admitting: Internal Medicine

## 2020-03-10 ENCOUNTER — Other Ambulatory Visit: Payer: Self-pay

## 2020-03-10 DIAGNOSIS — I10 Essential (primary) hypertension: Secondary | ICD-10-CM | POA: Insufficient documentation

## 2020-03-10 DIAGNOSIS — Z9109 Other allergy status, other than to drugs and biological substances: Secondary | ICD-10-CM | POA: Insufficient documentation

## 2020-03-10 DIAGNOSIS — J9801 Acute bronchospasm: Secondary | ICD-10-CM | POA: Insufficient documentation

## 2020-03-10 DIAGNOSIS — F988 Other specified behavioral and emotional disorders with onset usually occurring in childhood and adolescence: Secondary | ICD-10-CM | POA: Insufficient documentation

## 2020-03-10 DIAGNOSIS — Z8659 Personal history of other mental and behavioral disorders: Secondary | ICD-10-CM | POA: Insufficient documentation

## 2020-03-11 ENCOUNTER — Ambulatory Visit: Payer: BC Managed Care – PPO | Admitting: Cardiology

## 2020-04-05 ENCOUNTER — Other Ambulatory Visit: Payer: Self-pay | Admitting: Internal Medicine

## 2020-04-09 ENCOUNTER — Other Ambulatory Visit: Payer: Self-pay | Admitting: Internal Medicine

## 2020-04-09 ENCOUNTER — Ambulatory Visit: Payer: BC Managed Care – PPO | Admitting: Cardiology

## 2020-04-09 MED ORDER — AMLODIPINE BESYLATE 10 MG PO TABS: 10.0000 mg | ORAL_TABLET | Freq: Every day | ORAL | 0 refills | Status: DC

## 2020-04-22 ENCOUNTER — Telehealth: Payer: Self-pay | Admitting: Internal Medicine

## 2020-04-22 MED ORDER — AMLODIPINE BESYLATE 10 MG PO TABS: 10.0000 mg | ORAL_TABLET | Freq: Every day | ORAL | 0 refills | Status: DC

## 2020-04-22 MED ORDER — LOSARTAN POTASSIUM 100 MG PO TABS: 100.0000 mg | ORAL_TABLET | Freq: Every day | ORAL | 0 refills | Status: DC

## 2020-04-22 NOTE — Telephone Encounter (Signed)
15 day supplies sent for both meds. Last OV 07/2019- was to return in 4 months. Please schedule a visit prior to him running out. No further refills w/o visit.

## 2020-04-22 NOTE — Telephone Encounter (Signed)
Medication: losartan (COZAAR) 100 MG tablet [[id number]]    amLODipine (NORVASC) 10 MG tablet [[id number]]     Has the patient contacted their pharmacy? no (If no, request that the patient contact the pharmacy for the refill.) (If yes, when and what did the pharmacy advise?)    Preferred Pharmacy (with phone number or street name):  [id number] - [place],  - [address] AT [facility] OF [facility] [facility] [address] Phone:  [phone]  Fax:  [phone]        Agent: Please be advised that RX refills may take up to 3 business days. We ask that you follow-up with your pharmacy.

## 2020-04-22 NOTE — Telephone Encounter (Signed)
Spoke to pt. appt scheduled

## 2020-04-28 ENCOUNTER — Ambulatory Visit (INDEPENDENT_AMBULATORY_CARE_PROVIDER_SITE_OTHER): Payer: BC Managed Care – PPO | Admitting: Internal Medicine

## 2020-04-28 ENCOUNTER — Other Ambulatory Visit: Payer: Self-pay

## 2020-04-28 ENCOUNTER — Telehealth: Payer: Self-pay | Admitting: Internal Medicine

## 2020-04-28 ENCOUNTER — Encounter: Payer: Self-pay | Admitting: Internal Medicine

## 2020-04-28 VITALS — BP 190/110 | HR 94 | Temp 98.0°F | Resp 18 | Ht 66.0 in | Wt 267.1 lb

## 2020-04-28 DIAGNOSIS — I160 Hypertensive urgency: Secondary | ICD-10-CM

## 2020-04-28 DIAGNOSIS — I10 Essential (primary) hypertension: Secondary | ICD-10-CM | POA: Diagnosis not present

## 2020-04-28 DIAGNOSIS — Z23 Encounter for immunization: Secondary | ICD-10-CM | POA: Diagnosis not present

## 2020-04-28 DIAGNOSIS — Z79899 Other long term (current) drug therapy: Secondary | ICD-10-CM | POA: Diagnosis not present

## 2020-04-28 DIAGNOSIS — F419 Anxiety disorder, unspecified: Secondary | ICD-10-CM | POA: Diagnosis not present

## 2020-04-28 MED ORDER — FLUOXETINE HCL 20 MG PO TABS: ORAL_TABLET | ORAL | 0 refills | Status: DC

## 2020-04-28 MED ORDER — AMLODIPINE BESYLATE 10 MG PO TABS: 10.0000 mg | ORAL_TABLET | Freq: Every day | ORAL | 3 refills | Status: DC

## 2020-04-28 MED ORDER — FLUOXETINE HCL 10 MG PO CAPS: ORAL_CAPSULE | ORAL | 1 refills | Status: DC

## 2020-04-28 MED ORDER — CARVEDILOL 25 MG PO TABS: 25.0000 mg | ORAL_TABLET | Freq: Two times a day (BID) | ORAL | 3 refills | Status: DC

## 2020-04-28 MED ORDER — LOSARTAN POTASSIUM 100 MG PO TABS: 100.0000 mg | ORAL_TABLET | Freq: Every day | ORAL | 3 refills | Status: DC

## 2020-04-28 MED ORDER — CLONAZEPAM 0.5 MG PO TABS: 0.5000 mg | ORAL_TABLET | Freq: Two times a day (BID) | ORAL | 0 refills | Status: DC | PRN

## 2020-04-28 NOTE — Telephone Encounter (Signed)
Rx sent 

## 2020-04-28 NOTE — Patient Instructions (Addendum)
Your blood pressure is high.  Take the medications as recommended including carvedilol 25 mg twice a day  If you have any unusual or severe headache, nausea, double vision, severe chest pain: Go to the ER  Check blood pressure daily if possible.  For stress: Start fluoxetine 20 mg: Half tablet every morning for 1 week, then 1 tablet every morning. Take clonazepam twice a day as needed, recommend to take for a while 1 every night to help you sleep    GO TO THE LAB : Get the blood work     GO TO THE FRONT DESK, PLEASE SCHEDULE YOUR APPOINTMENTS Come back for checkup with me in 1 week

## 2020-04-28 NOTE — Telephone Encounter (Signed)
[facility] pharmacist states patient insurance would not cover tablet. Please change rx to   Fluoxetine 10 mg capsule   take 1 pill for a week, then 2 pills a day  [id number] - [place], [place] - [address] AT [facility] OF [address]  [address], [place] [address]  Phone:  [phone] Fax:  [phone]

## 2020-04-28 NOTE — Telephone Encounter (Signed)
Fluoxetine 10 mg capsule: 1 capsule daily for 1 week, then 2 capsules daily #60 and 1 refill

## 2020-04-28 NOTE — Telephone Encounter (Signed)
Requesting to switch to capsules- insurance doesn't cover tablets.

## 2020-04-28 NOTE — Progress Notes (Signed)
Pre visit review using our clinic review tool, if applicable. No additional management support is needed unless otherwise documented below in the visit note. 

## 2020-04-28 NOTE — Progress Notes (Signed)
Subjective:    Patient ID: Mathew Daniels, male    DOB: [birth year], 40 y.o.   MRN: [id number]  DOS:  04/28/2020 Type of visit - description: f/u   Multiple issues d/w pt today. HTN: BP is very high, he has been getting less carvedilol than usual. Anxiety: Not better, stress has actually increased lately, not sleeping well. Tobacco: Back to smoking, pack a day.  Request help to quit.   Review of Systems Denies difficulty breathing, occasionally has chest pain, see previous visit, unchanged from previous chest pains. No headache, no dizziness. No visual disturbances  Past Medical History:  Diagnosis Date  . ADD   . ADHD 03/24/2008   Qualifier: Diagnosis of  By: Drue Novel MD, Nolon Rod ALLERGIC RHINITIS 03/24/2008   Qualifier: Diagnosis of  By: Drue Novel MD, Sahara Fujimoto E.   . Allergic rhinitis 08/01/2013  . Annual physical exam 06/26/2012  . Anxiety 03/24/2008   Qualifier: Diagnosis of  By: Drue Novel MD, Spirit Wernli E.   . Atypical chest pain 06/20/2019  . Bronchospasm   . Dyslipidemia 06/20/2019  . Dyspnea on exertion 06/20/2019  . Environmental allergies   . Epistaxis 11/26/2018  . Essential hypertension 03/24/2008   Qualifier: Diagnosis of  By: Drue Novel MD, Nolon Rod   . H/O: depression   . HTN   . Morbid obesity (HCC) 08/01/2019  . PCP Notes>>>> 11/20/2017    Past Surgical History:  Procedure Laterality Date  . TENDON REPAIR Right 1998   ankle    Allergies as of 04/28/2020      Reactions   Amoxicillin    Blisters feet hand July 2013, took amoxicillin before w/o problems   Penicillins Other (See Comments)   Blisters feet hand July 2013, took amoxicillin before w/o problems Blisters feet hand July 2013, took amoxicillin before w/o problems Peeling on feet      Medication List       Accurate as of April 28, 2020 11:59 PM. If you have any questions, ask your nurse or doctor.        amLODipine 10 MG tablet Commonly known as: NORVASC Take 1 tablet (10 mg total) by mouth daily.   carvedilol 25 MG  tablet Commonly known as: COREG Take 1 tablet (25 mg total) by mouth 2 (two) times daily with a meal. What changed: Another medication with the same name was removed. Continue taking this medication, and follow the directions you see here. Changed by: Willow Ora, MD   clonazePAM 0.5 MG tablet Commonly known as: KLONOPIN Take 1 tablet (0.5 mg total) by mouth 2 (two) times daily as needed for anxiety.   FLUoxetine 10 MG capsule Commonly known as: PROZAC Take 1 capsule by mouth daily for 1 week, then increase to 2 capsules daily Started by: Willow Ora, MD   losartan 100 MG tablet Commonly known as: COZAAR Take 1 tablet (100 mg total) by mouth daily.   multivitamin tablet Take 1 tablet by mouth daily. Optimen   pantoprazole 40 MG tablet Commonly known as: PROTONIX Take 1 tablet (40 mg total) by mouth daily before breakfast.          Objective:   Physical Exam BP (!) 190/110 (BP Location: Left Arm, Patient Position: Sitting, Cuff Size: Normal)   Pulse 94   Temp 98 F (36.7 C) (Oral)   Resp 18   Ht 5\' 6"  (1.676 m)   Wt 267 lb 2 oz (121.2 kg)   SpO2 96%   BMI 43.12 kg/m  General:   Well developed, NAD, BMI noted. HEENT:. Normocephalic . Face symmetric, atraumatic Undilated funduscopy: No obvious hemorrhage, discs sharp. Lungs:  CTA B Normal respiratory effort, no intercostal retractions, no accessory muscle use. Heart: RRR,  no murmur.  Lower extremities: no pretibial edema bilaterally  Skin: Not pale. Not jaundice Neurologic:  alert & oriented X3.  Speech normal, gait appropriate for age and unassisted Psych--  Cognition and judgment appear intact.  Cooperative with normal attention span and concentration.  Behavior appropriate. No anxious or depressed appearing.      Assessment       Assessment   HTN dx ~ 34 Anxiety ADHD Tobacco  Morbid obesity + FH CAD: Saw cardiology, coronary calcium score: 0 (05-2019)  PLAN: Hypertensive urgency: good compliance  with Losartan and amlodipine, for an unclear reason instead of carvedilol 25 mg he is getting carvedilol 12.5 mg and takes it twice a day. BP is very high today, I rechecked it 190/110.  He is asymptomatic, risk of strokes and other severe complications discussed, patient verbalized understanding. Plan: CMP, CBC, go back on carvedilol 25 mg twice daily, continue other medications, ER if: Headache, severe chest pain, see AVS. Reassess in 1 week. Anxiety: Currently on clonazepam, takes it rarely, think he is ready for a daily medication.  Stress at work apparently worse per patient. Plan: Start fluoxetine, refill clonazepam, UDS, encouraged to take 1 clonazepam at bedtime so he can rest. Tobacco: Smoking more than before, one pack a day, Wellbutrin would be a good option to help him quit, reassess in few weeks, he is just starting SSRIs.. Preventive care: Flu shot today RTC 1 week.    This visit occurred during the SARS-CoV-2 public health emergency.  Safety protocols were in place, including screening questions prior to the visit, additional usage of staff PPE, and extensive cleaning of exam room while observing appropriate contact time as indicated for disinfecting solutions.

## 2020-04-29 LAB — COMPREHENSIVE METABOLIC PANEL
ALT: 30 U/L (ref 0–53)
AST: 21 U/L (ref 0–37)
Albumin: 4.6 g/dL (ref 3.5–5.2)
Alkaline Phosphatase: 75 U/L (ref 39–117)
BUN: 14 mg/dL (ref 6–23)
CO2: 32 mEq/L (ref 19–32)
Calcium: 10 mg/dL (ref 8.4–10.5)
Chloride: 98 mEq/L (ref 96–112)
Creatinine, Ser: 0.72 mg/dL (ref 0.40–1.50)
GFR: 113.35 mL/min (ref >60.00)
Glucose, Bld: 97 mg/dL (ref 70–99)
Potassium: 4.5 mEq/L (ref 3.5–5.1)
Sodium: 137 mEq/L (ref 135–145)
Total Bilirubin: 0.5 mg/dL (ref 0.2–1.2)
Total Protein: 7.4 g/dL (ref 6.0–8.3)

## 2020-04-29 LAB — CBC WITH DIFFERENTIAL/PLATELET
Basophils Absolute: 0.1 10*3/uL (ref 0.0–0.1)
Basophils Relative: 0.7 % (ref 0.0–3.0)
Eosinophils Absolute: 0.1 10*3/uL (ref 0.0–0.7)
Eosinophils Relative: 1.2 % (ref 0.0–5.0)
HCT: 46.9 % (ref 39.0–52.0)
Hemoglobin: 15.6 g/dL (ref 13.0–17.0)
Lymphocytes Relative: 17.1 % (ref 12.0–46.0)
Lymphs Abs: 1.7 10*3/uL (ref 0.7–4.0)
MCHC: 33.3 g/dL (ref 30.0–36.0)
MCV: 92.5 fl (ref 78.0–100.0)
Monocytes Absolute: 0.8 10*3/uL (ref 0.1–1.0)
Monocytes Relative: 7.6 % (ref 3.0–12.0)
Neutro Abs: 7.4 10*3/uL (ref 1.4–7.7)
Neutrophils Relative %: 73.4 % (ref 43.0–77.0)
Platelets: 229 10*3/uL (ref 150.0–400.0)
RBC: 5.07 Mil/uL (ref 4.22–5.81)
RDW: 14.5 % (ref 11.5–15.5)
WBC: 10.1 10*3/uL (ref 4.0–10.5)

## 2020-04-29 NOTE — Assessment & Plan Note (Signed)
Hypertensive urgency: good compliance with Losartan and amlodipine, for an unclear reason instead of carvedilol 25 mg he is getting carvedilol 12.5 mg and takes it twice a day. BP is very high today, I rechecked it 190/110.  He is asymptomatic, risk of strokes and other severe complications discussed, patient verbalized understanding. Plan: CMP, CBC, go back on carvedilol 25 mg twice daily, continue other medications, ER if: Headache, severe chest pain, see AVS. Reassess in 1 week. Anxiety: Currently on clonazepam, takes it rarely, think he is ready for a daily medication.  Stress at work apparently worse per patient. Plan: Start fluoxetine, refill clonazepam, UDS, encouraged to take 1 clonazepam at bedtime so he can rest. Tobacco: Smoking more than before, one pack a day, Wellbutrin would be a good option to help him quit, reassess in few weeks, he is just starting SSRIs.. Preventive care: Flu shot today RTC 1 week.

## 2020-04-30 LAB — DRUG MONITORING, PANEL 8 WITH CONFIRMATION, URINE
6 Acetylmorphine: NEGATIVE ng/mL (ref <10)
Alcohol Metabolites: POSITIVE ng/mL — AB
Amphetamines: NEGATIVE ng/mL (ref <500)
Benzodiazepines: NEGATIVE ng/mL (ref <100)
Buprenorphine, Urine: NEGATIVE ng/mL (ref <5)
Cocaine Metabolite: NEGATIVE ng/mL (ref <150)
Creatinine: 15 mg/dL
Ethyl Glucuronide (ETG): 935 ng/mL — ABNORMAL HIGH (ref <500)
Ethyl Sulfate (ETS): 179 ng/mL — ABNORMAL HIGH (ref <100)
MDMA: NEGATIVE ng/mL (ref <500)
Marijuana Metabolite: NEGATIVE ng/mL (ref <20)
Opiates: NEGATIVE ng/mL (ref <100)
Oxidant: NEGATIVE ug/mL
Oxycodone: NEGATIVE ng/mL (ref <100)
Specific Gravity: 1.004 (ref >=1.0)
pH: 6.7 (ref 4.5–9.0)

## 2020-04-30 LAB — DM TEMPLATE

## 2020-05-05 ENCOUNTER — Ambulatory Visit (INDEPENDENT_AMBULATORY_CARE_PROVIDER_SITE_OTHER): Payer: BC Managed Care – PPO | Admitting: Internal Medicine

## 2020-05-05 ENCOUNTER — Encounter: Payer: Self-pay | Admitting: Internal Medicine

## 2020-05-05 ENCOUNTER — Other Ambulatory Visit: Payer: Self-pay

## 2020-05-05 VITALS — BP 150/90 | HR 83 | Temp 98.3°F | Resp 18 | Ht 66.0 in | Wt 266.4 lb

## 2020-05-05 DIAGNOSIS — I10 Essential (primary) hypertension: Secondary | ICD-10-CM | POA: Diagnosis not present

## 2020-05-05 DIAGNOSIS — Z0189 Encounter for other specified special examinations: Secondary | ICD-10-CM

## 2020-05-05 DIAGNOSIS — F419 Anxiety disorder, unspecified: Secondary | ICD-10-CM | POA: Diagnosis not present

## 2020-05-05 MED ORDER — FLUOXETINE HCL 10 MG PO CAPS: 20.0000 mg | ORAL_CAPSULE | Freq: Every day | ORAL | 4 refills | Status: DC

## 2020-05-05 MED ORDER — LOSARTAN POTASSIUM 100 MG PO TABS: 100.0000 mg | ORAL_TABLET | Freq: Every day | ORAL | 4 refills | Status: DC

## 2020-05-05 MED ORDER — AMLODIPINE BESYLATE 10 MG PO TABS: 10.0000 mg | ORAL_TABLET | Freq: Every day | ORAL | 4 refills | Status: DC

## 2020-05-05 MED ORDER — CARVEDILOL 25 MG PO TABS: 25.0000 mg | ORAL_TABLET | Freq: Two times a day (BID) | ORAL | 4 refills | Status: DC

## 2020-05-05 NOTE — Progress Notes (Signed)
Subjective:    Patient ID: Mathew Daniels, male    DOB: [birth year], 40 y.o.   MRN: [id number]  DOS:  05/05/2020 Type of visit - description: Follow-up Since the last office visit, states he is doing "100% better". Anxiety decreased, sleeping better. Good compliance with BP meds. He just got a new BP cuff,  BPs at home are better  Review of Systems See above   Past Medical History:  Diagnosis Date  . ADD   . ADHD 03/24/2008   Qualifier: Diagnosis of  By: Drue Novel MD, Nolon Rod ALLERGIC RHINITIS 03/24/2008   Qualifier: Diagnosis of  By: Drue Novel MD, Winta Barcelo E.   . Allergic rhinitis 08/01/2013  . Annual physical exam 06/26/2012  . Anxiety 03/24/2008   Qualifier: Diagnosis of  By: Drue Novel MD, Minnie Shi E.   . Atypical chest pain 06/20/2019  . Bronchospasm   . Dyslipidemia 06/20/2019  . Dyspnea on exertion 06/20/2019  . Environmental allergies   . Epistaxis 11/26/2018  . Essential hypertension 03/24/2008   Qualifier: Diagnosis of  By: Drue Novel MD, Nolon Rod   . H/O: depression   . HTN   . Morbid obesity (HCC) 08/01/2019  . PCP Notes>>>> 11/20/2017    Past Surgical History:  Procedure Laterality Date  . TENDON REPAIR Right 1998   ankle    Allergies as of 05/05/2020      Reactions   Amoxicillin    Blisters feet hand July 2013, took amoxicillin before w/o problems   Penicillins Other (See Comments)   Blisters feet hand July 2013, took amoxicillin before w/o problems Blisters feet hand July 2013, took amoxicillin before w/o problems Peeling on feet      Medication List       Accurate as of May 05, 2020 10:47 AM. If you have any questions, ask your nurse or doctor.        amLODipine 10 MG tablet Commonly known as: NORVASC Take 1 tablet (10 mg total) by mouth daily.   carvedilol 25 MG tablet Commonly known as: COREG Take 1 tablet (25 mg total) by mouth 2 (two) times daily with a meal.   clonazePAM 0.5 MG tablet Commonly known as: KLONOPIN Take 1 tablet (0.5 mg total) by mouth 2 (two)  times daily as needed for anxiety.   FLUoxetine 10 MG capsule Commonly known as: PROZAC Take 1 capsule by mouth daily for 1 week, then increase to 2 capsules daily   losartan 100 MG tablet Commonly known as: COZAAR Take 1 tablet (100 mg total) by mouth daily.   multivitamin tablet Take 1 tablet by mouth daily. Optimen   pantoprazole 40 MG tablet Commonly known as: PROTONIX Take 1 tablet (40 mg total) by mouth daily before breakfast.          Objective:   Physical Exam BP (!) 152/100 (BP Location: Left Arm, Patient Position: Sitting, Cuff Size: Normal)   Pulse 83   Temp 98.3 F (36.8 C) (Oral)   Resp 18   Ht 5\' 6"  (1.676 m)   Wt 266 lb 6 oz (120.8 kg)   SpO2 100%   BMI 42.99 kg/m  General:   Well developed, NAD, BMI noted. HEENT:  Normocephalic . Face symmetric, atraumatic Lungs:  CTA B Normal respiratory effort, no intercostal retractions, no accessory muscle use. Heart: RRR,  no murmur.  Lower extremities: no pretibial edema bilaterally  Skin: Not pale. Not jaundice Neurologic:  alert & oriented X3.  Speech normal, gait appropriate for age  and unassisted Psych--  Cognition and judgment appear intact.  Cooperative with normal attention span and concentration.  Behavior appropriate. No anxious or depressed appearing.      Assessment       Assessment   HTN dx ~ 34 Anxiety ADHD Tobacco  Morbid obesity + FH CAD: Saw cardiology, coronary calcium score: 0 (05-2019)  PLAN: HTN Good compliance with amlodipine, carvedilol, losartan, last BMP okay,BP today still elevated, I re-check  manually: 155/90.  @ home w/ a wrist cuff in the last couple of day 130/90. Plan: Continue present care, continue monitoring BPs daily, watch salt intake, increase exercise.  Reassess in 2 months, sooner if needed. Morbid obesity, HTN, heavy snoring: Previously declined a sleep study, I explained the risk of undiagnosed OSA, he agreed to be referred. Anxiety: Started fluoxetine,  taking clonazepam at night, feels much better, contract for benzos signed today, RF. FH CAD: Recommend to keep appointment with cardiology in few weeks. RTC 2 months   This visit occurred during the SARS-CoV-2 public health emergency.  Safety protocols were in place, including screening questions prior to the visit, additional usage of staff PPE, and extensive cleaning of exam room while observing appropriate contact time as indicated for disinfecting solutions.

## 2020-05-05 NOTE — Progress Notes (Signed)
Pre visit review using our clinic review tool, if applicable. No additional management support is needed unless otherwise documented below in the visit note. 

## 2020-05-05 NOTE — Patient Instructions (Signed)
Check the  blood pressure   daily BP GOAL is between 110/65 and  135/85. If it is consistently higher or lower, let me know    GO TO THE FRONT DESK, PLEASE SCHEDULE YOUR APPOINTMENTS Come back for  A check up in 2 months

## 2020-05-06 NOTE — Assessment & Plan Note (Signed)
HTN Good compliance with amlodipine, carvedilol, losartan, last BMP okay,BP today still elevated, I re-check  manually: 155/90.  @ home w/ a wrist cuff in the last couple of day 130/90. Plan: Continue present care, continue monitoring BPs daily, watch salt intake, increase exercise.  Reassess in 2 months, sooner if needed. Morbid obesity, HTN, heavy snoring: Previously declined a sleep study, I explained the risk of undiagnosed OSA, he agreed to be referred. Anxiety: Started fluoxetine, taking clonazepam at night, feels much better, contract for benzos signed today, RF. FH CAD: Recommend to keep appointment with cardiology in few weeks. RTC 2 months

## 2020-05-12 ENCOUNTER — Encounter: Payer: Self-pay | Admitting: Internal Medicine

## 2020-05-13 ENCOUNTER — Other Ambulatory Visit: Payer: Self-pay | Admitting: Internal Medicine

## 2020-05-13 MED ORDER — VARENICLINE TARTRATE 1 MG PO TABS: 1.0000 mg | ORAL_TABLET | Freq: Two times a day (BID) | ORAL | 1 refills | Status: DC

## 2020-05-13 MED ORDER — CHANTIX STARTING MONTH PAK 0.5 MG X 11 & 1 MG X 42 PO TABS: ORAL_TABLET | ORAL | 0 refills | Status: DC

## 2020-06-02 ENCOUNTER — Ambulatory Visit (INDEPENDENT_AMBULATORY_CARE_PROVIDER_SITE_OTHER): Payer: BC Managed Care – PPO | Admitting: Neurology

## 2020-06-02 ENCOUNTER — Encounter: Payer: Self-pay | Admitting: Neurology

## 2020-06-02 VITALS — BP 200/111 | HR 80 | Ht 66.0 in | Wt 264.0 lb

## 2020-06-02 DIAGNOSIS — E66813 Obesity, class 3: Secondary | ICD-10-CM | POA: Insufficient documentation

## 2020-06-02 DIAGNOSIS — I10 Essential (primary) hypertension: Secondary | ICD-10-CM | POA: Diagnosis not present

## 2020-06-02 DIAGNOSIS — R0600 Dyspnea, unspecified: Secondary | ICD-10-CM

## 2020-06-02 DIAGNOSIS — R0683 Snoring: Secondary | ICD-10-CM

## 2020-06-02 DIAGNOSIS — R0609 Other forms of dyspnea: Secondary | ICD-10-CM

## 2020-06-02 DIAGNOSIS — R0789 Other chest pain: Secondary | ICD-10-CM

## 2020-06-02 DIAGNOSIS — Z6841 Body Mass Index (BMI) 40.0 and over, adult: Secondary | ICD-10-CM

## 2020-06-02 DIAGNOSIS — G478 Other sleep disorders: Secondary | ICD-10-CM

## 2020-06-02 DIAGNOSIS — F10982 Alcohol use, unspecified with alcohol-induced sleep disorder: Secondary | ICD-10-CM

## 2020-06-02 DIAGNOSIS — Z9189 Other specified personal risk factors, not elsewhere classified: Secondary | ICD-10-CM

## 2020-06-02 HISTORY — DX: Morbid (severe) obesity due to excess calories: E66.01

## 2020-06-02 HISTORY — DX: Body Mass Index (BMI) 40.0 and over, adult: Z684

## 2020-06-02 HISTORY — DX: Other specified personal risk factors, not elsewhere classified: Z91.89

## 2020-06-02 HISTORY — DX: Snoring: R06.83

## 2020-06-02 MED ORDER — TRAZODONE HCL 50 MG PO TABS: 50.0000 mg | ORAL_TABLET | Freq: Every evening | ORAL | 5 refills | Status: DC | PRN

## 2020-06-02 NOTE — Patient Instructions (Signed)
Insomnia Insomnia is a sleep disorder that makes it difficult to fall asleep or stay asleep. Insomnia can cause fatigue, low energy, difficulty concentrating, mood swings, and poor performance at work or school. There are three different ways to classify insomnia:  Difficulty falling asleep.  Difficulty staying asleep.  Waking up too early in the morning. Any type of insomnia can be long-term (chronic) or short-term (acute). Both are common. Short-term insomnia usually lasts for three months or less. Chronic insomnia occurs at least three times a week for longer than three months. What are the causes? Insomnia may be caused by another condition, situation, or substance, such as:  Anxiety.  Certain medicines.  Gastroesophageal reflux disease (GERD) or other gastrointestinal conditions.  Asthma or other breathing conditions.  Restless legs syndrome, sleep apnea, or other sleep disorders.  Chronic pain.  Menopause.  Stroke.  Abuse of alcohol, tobacco, or illegal drugs.  Mental health conditions, such as depression.  Caffeine.  Neurological disorders, such as Alzheimer's disease.  An overactive thyroid (hyperthyroidism). Sometimes, the cause of insomnia may not be known. What increases the risk? Risk factors for insomnia include:  Gender. Women are affected more often than men.  Age. Insomnia is more common as you get older.  Stress.  Lack of exercise.  Irregular work schedule or working night shifts.  Traveling between different time zones.  Certain medical and mental health conditions. What are the signs or symptoms? If you have insomnia, the main symptom is having trouble falling asleep or having trouble staying asleep. This may lead to other symptoms, such as:  Feeling fatigued or having low energy.  Feeling nervous about going to sleep.  Not feeling rested in the morning.  Having trouble concentrating.  Feeling irritable, anxious, or depressed. How  is this diagnosed? This condition may be diagnosed based on:  Your symptoms and medical history. Your health care provider may ask about: ? Your sleep habits. ? Any medical conditions you have. ? Your mental health.  A physical exam. How is this treated? Treatment for insomnia depends on the cause. Treatment may focus on treating an underlying condition that is causing insomnia. Treatment may also include:  Medicines to help you sleep.  Counseling or therapy.  Lifestyle adjustments to help you sleep better. Follow these instructions at home: Eating and drinking  Limit or avoid alcohol, caffeinated beverages, and cigarettes, especially close to bedtime. These can disrupt your sleep.  Do not eat a large meal or eat spicy foods right before bedtime. This can lead to digestive discomfort that can make it hard for you to sleep.   Sleep habits  Keep a sleep diary to help you and your health care provider figure out what could be causing your insomnia. Write down: ? When you sleep. ? When you wake up during the night. ? How well you sleep. ? How rested you feel the next day. ? Any side effects of medicines you are taking. ? What you eat and drink.  Make your bedroom a dark, comfortable place where it is easy to fall asleep. ? Put up shades or blackout curtains to block light from outside. ? Use a white noise machine to block noise. ? Keep the temperature cool.  Limit screen use before bedtime. This includes: ? Watching TV. ? Using your smartphone, tablet, or computer.  Stick to a routine that includes going to bed and waking up at the same times every day and night. This can help you fall asleep faster. Consider   making a quiet activity, such as reading, part of your nighttime routine.  Try to avoid taking naps during the day so that you sleep better at night.  Get out of bed if you are still awake after 15 minutes of trying to sleep. Keep the lights down, but try reading or  doing a quiet activity. When you feel sleepy, go back to bed.   General instructions  Take over-the-counter and prescription medicines only as told by your health care provider.  Exercise regularly, as told by your health care provider. Avoid exercise starting several hours before bedtime.  Use relaxation techniques to manage stress. Ask your health care provider to suggest some techniques that may work well for you. These may include: ? Breathing exercises. ? Routines to release muscle tension. ? Visualizing peaceful scenes.  Make sure that you drive carefully. Avoid driving if you feel very sleepy.  Keep all follow-up visits as told by your health care provider. This is important. Contact a health care provider if:  You are tired throughout the day.  You have trouble in your daily routine due to sleepiness.  You continue to have sleep problems, or your sleep problems get worse. Get help right away if:  You have serious thoughts about hurting yourself or someone else. If you ever feel like you may hurt yourself or others, or have thoughts about taking your own life, get help right away. You can go to your nearest emergency department or call:  Your local emergency services (911 in the [place]  A suicide crisis helpline, such as the National Suicide Prevention Lifeline at 1-800-273-8255. This is open 24 hours a day. Summary  Insomnia is a sleep disorder that makes it difficult to fall asleep or stay asleep.  Insomnia can be long-term (chronic) or short-term (acute).  Treatment for insomnia depends on the cause. Treatment may focus on treating an underlying condition that is causing insomnia.  Keep a sleep diary to help you and your health care provider figure out what could be causing your insomnia. This information is not intended to replace advice given to you by your health care provider. Make sure you discuss any questions you have with your health care provider. Document  Revised: 01/23/2020 Document Reviewed: 01/23/2020 Elsevier Patient Education  2021 [employer] Trazodone Tablets What is this medicine? TRAZODONE (TRAZ oh done) is used to treat depression. This medicine may be used for other purposes; ask your health care provider or pharmacist if you have questions. COMMON BRAND NAME(S): Desyrel What should I tell my health care provider before I take this medicine? They need to know if you have any of these conditions:  attempted suicide or thinking about it  bipolar disorder  bleeding problems  glaucoma  heart disease, or previous heart attack  irregular heart beat  kidney or liver disease  low levels of sodium in the blood  an unusual or allergic reaction to trazodone, other medicines, foods, dyes or preservatives  pregnant or trying to get pregnant  breast-feeding How should I use this medicine? Take this medicine by mouth with a glass of water. Follow the directions on the prescription label. Take this medicine shortly after a meal or a light snack. Take your medicine at regular intervals. Do not take your medicine more often than directed. Do not stop taking this medicine suddenly except upon the advice of your doctor. Stopping this medicine too quickly may cause serious side effects or your condition may worsen. A special MedGuide will   be given to you by the pharmacist with each prescription and refill. Be sure to read this information carefully each time. Talk to your pediatrician regarding the use of this medicine in children. Special care may be needed. Overdosage: If you think you have taken too much of this medicine contact a poison control center or emergency room at once. NOTE: This medicine is only for you. Do not share this medicine with others. What if I miss a dose? If you miss a dose, take it as soon as you can. If it is almost time for your next dose, take only that dose. Do not take double or extra doses. What may  interact with this medicine? Do not take this medicine with any of the following medications:  certain medicines for fungal infections like fluconazole, itraconazole, ketoconazole, posaconazole, voriconazole  cisapride  dronedarone  linezolid  MAOIs like Carbex, Eldepryl, Marplan, Nardil, and Parnate  mesoridazine  methylene blue (injected into a vein)  pimozide  saquinavir  thioridazine This medicine may also interact with the following medications:  alcohol  antiviral medicines for HIV or AIDS  aspirin and aspirin-like medicines  barbiturates like phenobarbital  certain medicines for blood pressure, heart disease, irregular heart beat  certain medicines for depression, anxiety, or psychotic disturbances  certain medicines for migraine headache like almotriptan, eletriptan, frovatriptan, naratriptan, rizatriptan, sumatriptan, zolmitriptan  certain medicines for seizures like carbamazepine and phenytoin  certain medicines for sleep  certain medicines that treat or prevent blood clots like dalteparin, enoxaparin, warfarin  digoxin  fentanyl  lithium  NSAIDS, medicines for pain and inflammation, like ibuprofen or naproxen  other medicines that prolong the QT interval (cause an abnormal heart rhythm) like dofetilide  rasagiline  supplements like St. John's wort, kava kava, valerian  tramadol  tryptophan This list may not describe all possible interactions. Give your health care provider a list of all the medicines, herbs, non-prescription drugs, or dietary supplements you use. Also tell them if you smoke, drink alcohol, or use illegal drugs. Some items may interact with your medicine. What should I watch for while using this medicine? Tell your doctor if your symptoms do not get better or if they get worse. Visit your doctor or health care professional for regular checks on your progress. Because it may take several weeks to see the full effects of this  medicine, it is important to continue your treatment as prescribed by your doctor. Patients and their families should watch out for new or worsening thoughts of suicide or depression. Also watch out for sudden changes in feelings such as feeling anxious, agitated, panicky, irritable, hostile, aggressive, impulsive, severely restless, overly excited and hyperactive, or not being able to sleep. If this happens, especially at the beginning of treatment or after a change in dose, call your health care professional. You may get drowsy or dizzy. Do not drive, use machinery, or do anything that needs mental alertness until you know how this medicine affects you. Do not stand or sit up quickly, especially if you are an older patient. This reduces the risk of dizzy or fainting spells. Alcohol may interfere with the effect of this medicine. Avoid alcoholic drinks. This medicine may cause dry eyes and blurred vision. If you wear contact lenses you may feel some discomfort. Lubricating drops may help. See your eye doctor if the problem does not go away or is severe. Your mouth may get dry. Chewing sugarless gum, sucking hard candy and drinking plenty of water may help. Contact   your doctor if the problem does not go away or is severe. What side effects may I notice from receiving this medicine? Side effects that you should report to your doctor or health care professional as soon as possible:  allergic reactions like skin rash, itching or hives, swelling of the face, lips, or tongue  elevated mood, decreased need for sleep, racing thoughts, impulsive behavior  confusion  fast, irregular heartbeat  feeling faint or lightheaded, falls  feeling agitated, angry, or irritable  loss of balance or coordination  painful or prolonged erections  restlessness, pacing, inability to keep still  suicidal thoughts or other mood changes  tremors  trouble sleeping  seizures  unusual bleeding or bruising Side  effects that usually do not require medical attention (report to your doctor or health care professional if they continue or are bothersome):  change in sex drive or performance  change in appetite or weight  constipation  headache  muscle aches or pains  nausea This list may not describe all possible side effects. Call your doctor for medical advice about side effects. You may report side effects to FDA at 1-800-FDA-1088. Where should I keep my medicine? Keep out of the reach of children. Store at room temperature between 15 and 30 degrees C (59 to 86 degrees F). Protect from light. Keep container tightly closed. Throw away any unused medicine after the expiration date. NOTE: This sheet is a summary. It may not cover all possible information. If you have questions about this medicine, talk to your doctor, pharmacist, or health care provider.  2021 Elsevier/Gold Standard (2020-02-03 14:46:11)  
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
## 2020-06-04 ENCOUNTER — Encounter: Payer: Self-pay | Admitting: Cardiology

## 2020-06-04 ENCOUNTER — Ambulatory Visit (INDEPENDENT_AMBULATORY_CARE_PROVIDER_SITE_OTHER): Payer: BC Managed Care – PPO | Admitting: Cardiology

## 2020-06-04 ENCOUNTER — Other Ambulatory Visit: Payer: Self-pay

## 2020-06-04 VITALS — BP 162/98 | HR 97 | Ht 66.0 in | Wt 267.0 lb

## 2020-06-04 DIAGNOSIS — I10 Essential (primary) hypertension: Secondary | ICD-10-CM | POA: Diagnosis not present

## 2020-06-04 DIAGNOSIS — F17200 Nicotine dependence, unspecified, uncomplicated: Secondary | ICD-10-CM | POA: Diagnosis not present

## 2020-06-04 DIAGNOSIS — IMO0001 Reserved for inherently not codable concepts without codable children: Secondary | ICD-10-CM

## 2020-06-04 DIAGNOSIS — R0683 Snoring: Secondary | ICD-10-CM | POA: Diagnosis not present

## 2020-06-04 DIAGNOSIS — R0789 Other chest pain: Secondary | ICD-10-CM

## 2020-06-04 HISTORY — DX: Nicotine dependence, unspecified, uncomplicated: F17.200

## 2020-06-04 HISTORY — DX: Reserved for inherently not codable concepts without codable children: IMO0001

## 2020-06-04 MED ORDER — HYDROCHLOROTHIAZIDE 12.5 MG PO CAPS: 12.5000 mg | ORAL_CAPSULE | Freq: Every day | ORAL | 1 refills | Status: DC

## 2020-06-04 NOTE — Addendum Note (Signed)
Addended by: Hazle Quant on: 06/04/2020 09:18 AM   Modules accepted: Orders

## 2020-06-04 NOTE — Patient Instructions (Signed)
Medication Instructions:  Your physician has recommended you make the following change in your medication:   START: Hydrochlorothiazide 12.5 mg daily   *If you need a refill on your cardiac medications before your next appointment, please call your pharmacy*   Lab Work: Your physician recommends that you return for lab work in 1 week: bmp   If you have labs (blood work) drawn today and your tests are completely normal, you will receive your results only by: Marland Kitchen MyChart Message (if you have MyChart) OR . A paper copy in the mail If you have any lab test that is abnormal or we need to change your treatment, we will call you to review the results.   Testing/Procedures: Your physician has requested that you have a renal artery duplex. During this test, an ultrasound is used to evaluate blood flow to the kidneys. Allow one hour for this exam. Do not eat after midnight the day before and avoid carbonated beverages. Take your medications as you usually do.     Follow-Up: At [facility], you and your health needs are our priority.  As part of our continuing mission to provide you with exceptional heart care, we have created designated Provider Care Teams.  These Care Teams include your primary Cardiologist (physician) and Advanced Practice Providers (APPs -  Physician Assistants and Nurse Practitioners) who all work together to provide you with the care you need, when you need it.  We recommend signing up for the patient portal called "MyChart".  Sign up information is provided on this After Visit Summary.  MyChart is used to connect with patients for Virtual Visits (Telemedicine).  Patients are able to view lab/test results, encounter notes, upcoming appointments, etc.  Non-urgent messages can be sent to your provider as well.   To learn more about what you can do with MyChart, go to [email or web address].    Your next appointment:   3 month(s)  The format for your next appointment:   In  Person  Provider:   Gypsy Balsam, MD   Other Instructions  Hydrochlorothiazide Capsules or Tablets What is this medicine? HYDROCHLOROTHIAZIDE (hye droe klor oh THYE a zide) is a diuretic. It helps you make more urine and to lose salt and excess water from your body. It treats swelling from heart, kidney, or liver disease. It also treats high blood pressure. This medicine may be used for other purposes; ask your health care provider or pharmacist if you have questions. COMMON BRAND NAME(S): Esidrix, Ezide, HydroDIURIL, Microzide, Oretic, Zide What should I tell my health care provider before I take this medicine? They need to know if you have any of these conditions:  diabetes  gout  kidney disease  liver disease  lupus  pancreatitis  an unusual or allergic reaction to hydrochlorothiazide, sulfa drugs, other medicines, foods, dyes, or preservatives  pregnant or trying to get pregnant  breast-feeding How should I use this medicine? Take this medicine by mouth. Take it as directed on the prescription label at the same time every day. You can take it with or without food. If it upsets your stomach, take it with food. Keep taking it unless your health care provider tells you to stop. Talk to your health care provider about the use of this medicine in children. While it may be prescribed for children as young as newborns for selected conditions, precautions do apply. Overdosage: If you think you have taken too much of this medicine contact a poison control center or emergency room  at once. NOTE: This medicine is only for you. Do not share this medicine with others. What if I miss a dose? If you miss a dose, take it as soon as you can. If it is almost time for your next dose, take only that dose. Do not take double or extra doses. What may interact with this medicine?  cholestyramine  colestipol  digoxin  dofetilide  lithium  medicines for blood pressure  medicines  for diabetes  medicines that relax muscles for surgery  other diuretics  steroid medicines like prednisone or cortisone This list may not describe all possible interactions. Give your health care provider a list of all the medicines, herbs, non-prescription drugs, or dietary supplements you use. Also tell them if you smoke, drink alcohol, or use illegal drugs. Some items may interact with your medicine. What should I watch for while using this medicine? Visit your health care provider for regular check ups. Check your blood pressure as directed. Ask your health care provider what your blood pressure should be. Also, find out when you should contact him or her. Do not treat yourself for coughs, colds, or pain while you are using this medicine without asking your health care provider for advice. Some medicines may increase your blood pressure. You may get drowsy or dizzy. Do not drive, use machinery, or do anything that needs mental alertness until you know how this medicine affects you. Do not stand or sit up quickly, especially if you are an older patient. This reduces the risk of dizzy or fainting spells. Alcohol can make you more drowsy and dizzy. Avoid alcoholic drinks. Talk to your health care professional about your risk of skin cancer. You may be more at risk for skin cancer if you take this medicine. This medicine can make you more sensitive to the sun. Keep out of the sun. If you cannot avoid being in the sun, wear protective clothing and use sunscreen. Do not use sun lamps or tanning beds/booths. You may need to be on a special diet while taking this medicine. Ask your health care provider. Also, find out how many glasses of fluids you need to drink each day. Check with your health care provider if you get an attack of severe diarrhea, nausea and vomiting, or if you sweat a lot. The loss of too much body fluid can make it dangerous for you to take this medicine. This medicine may increase  blood sugar. Ask your healthcare provider if changes in diet or medicines are needed if you have diabetes. What side effects may I notice from receiving this medicine? Side effects that you should report to your doctor or health care professional as soon as possible:  allergic reactions (skin rash, itching or hives; swelling of the face, lips, or tongue)  gout (severe pain, redness, or swelling in joints like the big toe)  high blood sugar (increased hunger, thirst or urination; unusually weak or tired; blurry vision)  kidney injury (trouble passing urine or change in the amount of urine)  low blood pressure (dizziness; feeling faint or lightheaded, falls; unusually weak or tired)  low potassium levels (trouble breathing; chest pain; dizziness; fast, irregular heartbeat; feeling faint or lightheaded, falls; muscle cramps or pain)  sudden change in vision or eye pain Side effects that usually do not require medical attention (report to your doctor or health care professional if they continue or are bothersome):  change in sex drive or performance  dry mouth  headache  stomach upset  This list may not describe all possible side effects. Call your doctor for medical advice about side effects. You may report side effects to FDA at 1-800-FDA-1088. Where should I keep my medicine? Keep out of the reach of children and pets. Store at room temperature between 20 and 25 degrees C (68 and 77 degrees F). Protect from light and moisture. Keep the container tightly closed. Do not freeze. Get rid of any unused medicine after the expiration date. To get rid of medicines that are no longer needed or have expired:  Take the medicine to a medicine take-back program. Check with your pharmacy or law enforcement to find a location.  If you cannot return the medicine, check the label or package insert to see if the medicine should be thrown out in the garbage or flushed down the toilet. If you are not  sure, ask your health care provider. If it is safe to put in the trash, empty the medicine out of the container. Mix the medicine with cat litter, dirt, coffee grounds, or other unwanted substance. Seal the mixture in a bag or container. Put it in the trash. NOTE: This sheet is a summary. It may not cover all possible information. If you have questions about this medicine, talk to your doctor, pharmacist, or health care provider.  2021 Elsevier/Gold Standard (2020-01-22 17:16:00)

## 2020-06-04 NOTE — Progress Notes (Signed)
Cardiology Office Note:    Date:  06/04/2020   ID:  Mathew Daniels, DOB [birth year], MRN [id number]  PCP:  Wanda Plump, MD  Cardiologist:  Gypsy Balsam, MD    Referring MD: Wanda Plump, MD   Chief Complaint  Patient presents with  . Hypertension  . Chest Pain    History of Present Illness:    Mathew Daniels is a 40 y.o. male with past medical history significant for essential hypertension, dyslipidemia, smoking, family history of premature coronary artery disease.  I did see him about a year ago because of atypical chest pain.  He was given proton pump inhibitor with dramatic improvement of the pain.  At that time we end up doing calcium score try to assess his risk for future events and calcium score came 0. He comes today to my office to discuss his issues.  He required multiple medications for blood pressure.  He is already on 3 medications which include beta-blocker and ARB as well as calcium channel blocker in spite of that his blood pressure is still elevated.  Recently, finally he agreed to have a sleep study and he is waiting for the test which will be done within the next few weeks.  I am also anxiously waiting for results of the test since I suspect he does have significant sleep apnea and management of sleep apnea should significantly help with his blood pressure.  He does not exercise on the regular basis.  He does have some exercise equipment at home but uses maybe only once.  Described to have some uneasy sensation in the chest that is not related to to exercise but overall seems to be doing well from that point review.  Described to have fatigue tiredness and shortness of breath.  He quit smoking about a week ago.  He is using Chantix to help him to accomplish the goal which is complete discontinuation of smoking.  Past Medical History:  Diagnosis Date  . ADD   . ADHD 03/24/2008   Qualifier: Diagnosis of  By: Drue Novel MD, Nolon Rod ALLERGIC RHINITIS 03/24/2008    Qualifier: Diagnosis of  By: Drue Novel MD, Jose E.   . Allergic rhinitis 08/01/2013  . Annual physical exam 06/26/2012  . Anxiety 03/24/2008   Qualifier: Diagnosis of  By: Drue Novel MD, Jose E.   . Atypical chest pain 06/20/2019  . Bronchospasm   . Dyslipidemia 06/20/2019  . Dyspnea on exertion 06/20/2019  . Environmental allergies   . Epistaxis 11/26/2018  . Essential hypertension 03/24/2008   Qualifier: Diagnosis of  By: Drue Novel MD, Nolon Rod   . H/O: depression   . HTN   . Morbid obesity (HCC) 08/01/2019  . PCP Notes>>>> 11/20/2017    Past Surgical History:  Procedure Laterality Date  . TENDON REPAIR Right 1998   ankle    Current Medications: Current Meds  Medication Sig  . amLODipine (NORVASC) 10 MG tablet Take 1 tablet (10 mg total) by mouth daily.  . carvedilol (COREG) 25 MG tablet Take 1 tablet (25 mg total) by mouth 2 (two) times daily with a meal.  . clonazePAM (KLONOPIN) 0.5 MG tablet Take 1 tablet (0.5 mg total) by mouth 2 (two) times daily as needed for anxiety.  Marland Kitchen losartan (COZAAR) 100 MG tablet Take 1 tablet (100 mg total) by mouth daily. (Patient taking differently: Take 50 mg by mouth daily.)  . Multiple Vitamin (MULTIVITAMIN) tablet Take 1 tablet by mouth daily. Optimen  .  traZODone (DESYREL) 50 MG tablet Take 1 tablet (50 mg total) by mouth at bedtime as needed for sleep.  . varenicline (CHANTIX STARTING MONTH PAK) 0.5 MG X 11 & 1 MG X 42 tablet Take one 0.5 mg tablet by mouth once daily for 3 days, then increase to one 0.5 mg tablet twice daily for 4 days, then increase to one 1 mg tablet twice daily.  . varenicline (CHANTIX) 1 MG tablet Take 1 tablet (1 mg total) by mouth 2 (two) times daily.  . [DISCONTINUED] pantoprazole (PROTONIX) 40 MG tablet Take 1 tablet (40 mg total) by mouth daily before breakfast.     Allergies:   Amoxicillin and Penicillins   Social History   Socioeconomic History  . Marital status: Single    Spouse name: Not on file  . Number of children: 1  . Years  of education: Not on file  . Highest education level: Not on file  Occupational History  . 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 Smoking status: Former Games developer  . Smokeless tobacco: Former Neurosurgeon  . Tobacco comment: quit smoking 2012, quit chewing 2014  Substance and Sexual Activity  . Alcohol use: Yes    Comment: socially  . Drug use: No  . Sexual activity: Not on file  Other Topics Concern  . Not on file  Social History Narrative   Divorced 2017   Lives w/ room mate    1 daughter born 2012 , 2 step kids               Social Determinants of Health   Financial Resource Strain: Not on file  Food Insecurity: Not on file  Transportation Needs: Not on file  Physical Activity: Not on file  Stress: Not on file  Social Connections: Not on file     Family History: The patient's family history includes Breast cancer in his mother; CAD in an other family member; CAD (age of onset: 58) in his father; Diabetes in his father; Stroke in his father. There is no history of Colon cancer or Cancer - Prostate. ROS:   Please see the history of present illness.    All 14 point review of systems negative except as described per history of present illness  EKGs/Labs/Other Studies Reviewed:      Recent Labs: 04/28/2020: ALT 30; BUN 14; Creatinine, Ser 0.72; Hemoglobin 15.6; Platelets 229.0; Potassium 4.5; Sodium 137  Recent Lipid Panel    Component Value Date/Time   CHOL 179 02/04/2019 0913   TRIG 53.0 02/04/2019 0913   HDL 62.60 02/04/2019 0913   CHOLHDL 3 02/04/2019 0913   VLDL 10.6 02/04/2019 0913   LDLCALC 105 (H) 02/04/2019 0913    Physical Exam:    VS:  BP (!) 162/98 (BP Location: Right Arm, Patient Position: Lying right side)   Pulse 97   Ht 5\' 6"  (1.676 m)   Wt 267 lb (121.1 kg)   SpO2 98%   BMI 43.09 kg/m     Wt Readings from Last 3 Encounters:  06/04/20 267 lb (121.1 kg)  06/02/20 264 lb (119.7 kg)  05/05/20 266 lb 6 oz (120.8 kg)      GEN:  Well nourished, well developed in no acute distress HEENT: Normal NECK: No JVD; No carotid bruits LYMPHATICS: No lymphadenopathy CARDIAC: RRR, no murmurs, no rubs, no gallops RESPIRATORY:  Clear to auscultation without rales, wheezing or rhonchi  ABDOMEN: Soft, non-tender, non-distended MUSCULOSKELETAL:  No edema; No deformity  SKIN: Warm and dry LOWER EXTREMITIES: no swelling NEUROLOGIC:  Alert and oriented x 3 PSYCHIATRIC:  Normal affect   ASSESSMENT:    1. Atypical chest pain   2. Essential hypertension   3. Snoring   4. Smoking    PLAN:    In order of problems listed above:  1. Atypical chest pain calcium score 0.  Very atypical features.  Will consider on risk factors modifications. 2. Essential hypertension which is still present and problematic.  I will add hydrochlorothiazide 12.5 daily to his medical regimen, Chem-7 will be checked in the next week.  I will schedule him to have duplex evaluation of his renal arteries make sure he does not have any significant renal artery stenosis.  However, I think etiology of his hypotension is multifactorial which is related to sleep apnea that being worked out right now. 3. Snoring and again I suspect strongly that he does have significant sleep apnea he is scheduled to have a sleep study will wait for results of the test. 4. Smoking, he quit a week ago encourage him to stay away from smoking. 5. Dyslipidemia I did calculate his 10 years risk which is less than 2% that is considered low.  No indications for medical therapy at this stage.  On top of that his calcium score was 0.  We did talk however about Mediterranean diet. 6. We did talk about healthy lifestyle which includes exercises on the regular basis and I encouraged him gradually to increase level of exercise start doing this every single day except skipping 1 or 2-day a week.  Hopefully he will be able to accomplish that.  I would like to see him back rather quickly  within the next 3 months to he see how he does.   Medication Adjustments/Labs and Tests Ordered: Current medicines are reviewed at length with the patient today.  Concerns regarding medicines are outlined above.  No orders of the defined types were placed in this encounter.  Medication changes: No orders of the defined types were placed in this encounter.   Signed, Georgeanna Lea, MD, [facility] 06/04/2020 9:07 AM    [facility]

## 2020-06-22 ENCOUNTER — Ambulatory Visit (INDEPENDENT_AMBULATORY_CARE_PROVIDER_SITE_OTHER): Payer: BC Managed Care – PPO | Admitting: Neurology

## 2020-06-22 DIAGNOSIS — R0609 Other forms of dyspnea: Secondary | ICD-10-CM

## 2020-06-22 DIAGNOSIS — R0600 Dyspnea, unspecified: Secondary | ICD-10-CM

## 2020-06-22 DIAGNOSIS — G4733 Obstructive sleep apnea (adult) (pediatric): Secondary | ICD-10-CM | POA: Diagnosis not present

## 2020-06-22 DIAGNOSIS — R0683 Snoring: Secondary | ICD-10-CM

## 2020-06-22 DIAGNOSIS — Z6841 Body Mass Index (BMI) 40.0 and over, adult: Secondary | ICD-10-CM

## 2020-06-22 DIAGNOSIS — I10 Essential (primary) hypertension: Secondary | ICD-10-CM

## 2020-06-22 DIAGNOSIS — F10982 Alcohol use, unspecified with alcohol-induced sleep disorder: Secondary | ICD-10-CM

## 2020-06-22 DIAGNOSIS — G478 Other sleep disorders: Secondary | ICD-10-CM

## 2020-06-22 DIAGNOSIS — R0789 Other chest pain: Secondary | ICD-10-CM

## 2020-06-22 DIAGNOSIS — Z9189 Other specified personal risk factors, not elsewhere classified: Secondary | ICD-10-CM

## 2020-06-25 NOTE — Progress Notes (Signed)
   [facility]  HOME SLEEP TEST ( HST by Watch PAT)  STUDY DATA LOAD: 06-25-20  DOB: May 31, 1980  MRN: [id number]  ORDERING CLINICIAN: Melvyn Novas, MD   REFERRING CLINICIAN: Wanda Plump, MD   CLINICAL INFORMATION/HISTORY:  06-12-2020. [name] reported that he has not slept well for over a year, and has now very high blood pressure, he gets about 5 -6 hours of sleep and feels not restored. He gained about 30 pounds during the pandemic- just alone in the last 18 months. He has reached a BMI of 42 and his sleep has deteriorated parallel to this.  His wife is fairly concerned about his irregular breathing which seems to indicate that he has apneas.  He is snoring.  He is a former smoker but is currently not using tobacco.  Mathew Daniels  has a medical history of ADD, ADHD (03/24/2008), ALLERGIC RHINITIS (03/24/2008), Allergic rhinitis (08/01/2013), Annual physical exam (06/26/2012), Anxiety (03/24/2008), Atypical chest pain (06/20/2019), Bronchospasm, Dyslipidemia (06/20/2019), Dyspnea on exertion (06/20/2019), Environmental allergies, Epistaxis (11/26/2018), Essential hypertension (03/24/2008), H/O: depression, HTN, Morbid obesity (HCC) (08/01/2019).  Epworth sleepiness score: 10/24.  BMI: 42.5 kg/m  Neck Circumference: 17 "  FINDINGS:   Total Recorded Time (hours, min): 8 h 27 min Total Sleep Time (hours, min):  7 h 2 min Percent REM (%):    25.12 %   Calculated pAHI (per hour): 10.9        REM pAHI: 30.4    NREM pAHI: 4.4 Supine AHI: 12.7   Oxygen Saturation (%) Mean: 93  Minimum oxygen saturation (%):        86  O2 Saturation Range (%): 86-97  O2Saturation (minutes) <=88%: 0.6 min  Pulse Mean (bpm):    74  Pulse Range (48-107)   IMPRESSION: This HST confirmed the presence of  Mild OSA (obstructive sleep apnea) with strong REM sleep dependence.  No clinically relevant total  Hypoxemia time was noted, but manu brief desaturation events clustered in REM sleep.  Sleep was fragmented. Moderate -severe snoring was noted.   RECOMMENDATION: This mild but REM dependent form of OSA needs to be treated with PAP therapy, as dental devices and inspire implants cannot address insufficient chest wall movement or impaired diaphragmatic movement. Weight loss can be the most effective long term plan of therapy. For now, we can address the OSA with auto PAP, setting a CPAP device to 5-15 cm water, 2 cm EPR, under heated humidification and using a mask/ interface of Patient's choice.     INTERPRETING PHYSICIAN:  Melvyn Novas, MD   [facility] Sleep Board certified by [employer] of Sleep Medicine and Fellow of the [employer] of Neu[employer]. Medical Director of [facility].

## 2020-07-03 ENCOUNTER — Ambulatory Visit: Payer: BC Managed Care – PPO | Admitting: Internal Medicine

## 2020-07-03 ENCOUNTER — Ambulatory Visit (HOSPITAL_BASED_OUTPATIENT_CLINIC_OR_DEPARTMENT_OTHER): Payer: BC Managed Care – PPO

## 2020-07-03 NOTE — Addendum Note (Signed)
Addended by: Melvyn Novas on: 07/03/2020 03:52 PM   Modules accepted: Orders

## 2020-07-03 NOTE — Procedures (Signed)
[facility]  HOME SLEEP TEST ( HST by Watch PAT)  STUDY DATA LOAD: 06-25-20  DOB: October 22, 1980  MRN: [id number]  ORDERING CLINICIAN: Melvyn Novas, MD   REFERRING CLINICIAN: Wanda Plump, MD   CLINICAL INFORMATION/HISTORY:  06-12-2020. [name] reported that he has not slept well for over a year, and has now very high blood pressure, he gets about 5 -6 hours of sleep and feels not restored. He gained about 30 pounds during the pandemic- just alone in the last 18 months. He has reached a BMI of 42 and his sleep has deteriorated parallel to this.  His wife is fairly concerned about his irregular breathing which seems to indicate that he has apneas.  He is snoring.  He is a former smoker but is currently not using tobacco.  Mathew Daniels  has a medical history of ADD, ADHD (03/24/2008), ALLERGIC RHINITIS (03/24/2008), Allergic rhinitis (08/01/2013), Annual physical exam (06/26/2012), Anxiety (03/24/2008), Atypical chest pain (06/20/2019), Bronchospasm, Dyslipidemia (06/20/2019), Dyspnea on exertion (06/20/2019), Environmental allergies, Epistaxis (11/26/2018), Essential hypertension (03/24/2008), H/O: depression, HTN, Morbid obesity (HCC) (08/01/2019).  Epworth sleepiness score: 10/24.  BMI: 42.5 kg/m  Neck Circumference: 17 "  FINDINGS:   Total Recorded Time (hours, min): 8 h 27 min Total Sleep Time (hours, min):  7 h 2 min Percent REM (%):    25.12 %   Calculated pAHI (per hour): 10.9        REM pAHI: 30.4    NREM pAHI: 4.4 Supine AHI: 12.7   Oxygen Saturation (%) Mean: 93  Minimum oxygen saturation (%):        86  O2 Saturation Range (%): 86-97  O2Saturation (minutes) <=88%: 0.6 min  Pulse Mean (bpm):    74  Pulse Range (48-107)   IMPRESSION: This HST confirmed the presence of  Mild OSA (obstructive sleep apnea) with strong REM sleep dependence.  No clinically relevant total  Hypoxemia time was noted, but many brief desaturation events clustered in REM sleep.  Sleep was fragmented. Moderate -severe snoring was noted.   RECOMMENDATION: This mild but REM dependent form of OSA needs to be treated with PAP therapy, as dental devices and inspire implants cannot address insufficient chest wall movement or impaired diaphragmatic movement. Weight loss can be the most effective long term plan of therapy. For now, we can address the OSA with auto PAP, setting a CPAP device to 5-15 cm water, 2 cm EPR, under heated humidification and using a mask/ interface of Patient's choice.     INTERPRETING PHYSICIAN:  Melvyn Novas, MD   [facility] Sleep Board certified by [employer] of Sleep Medicine and Fellow of the [employer] of [employer]. Medical Director of [facility].

## 2020-07-03 NOTE — Progress Notes (Signed)
IMPRESSION: This HST confirmed the presence of  Mild OSA (obstructive sleep apnea) with strong REM sleep dependence.  No clinically relevant total  Hypoxemia time was noted, but many brief desaturation events clustered in REM sleep. Sleep was fragmented. Moderate -severe snoring was noted.   RECOMMENDATION: This mild but REM dependent form of OSA needs to be treated with PAP therapy, as dental devices and inspire implants cannot address insufficient chest wall movement or impaired diaphragmatic movement. Weight loss can be the most effective long term plan of therapy. For now, we can address the OSA with auto PAP, setting a CPAP device to 5-15 cm water, 2 cm EPR, under heated humidification and using a mask/ interface of Patient's choice.    INTERPRETING PHYSICIAN:  Melvyn Novas, MD

## 2020-07-06 ENCOUNTER — Encounter: Payer: Self-pay | Admitting: Neurology

## 2020-07-07 ENCOUNTER — Ambulatory Visit: Payer: BC Managed Care – PPO | Admitting: Internal Medicine

## 2020-07-07 ENCOUNTER — Telehealth: Payer: Self-pay | Admitting: Neurology

## 2020-07-07 NOTE — Telephone Encounter (Signed)
-----   Message from Melvyn Novas, MD sent at 07/03/2020  3:52 PM EDT ----- IMPRESSION: This HST confirmed the presence of  Mild OSA (obstructive sleep apnea) with strong REM sleep dependence.  No clinically relevant total  Hypoxemia time was noted, but many brief desaturation events clustered in REM sleep. Sleep was fragmented. Moderate -severe snoring was noted.   RECOMMENDATION: This mild but REM dependent form of OSA needs to be treated with PAP therapy, as dental devices and inspire implants cannot address insufficient chest wall movement or impaired diaphragmatic movement. Weight loss can be the most effective long term plan of therapy. For now, we can address the OSA with auto PAP, setting a CPAP device to 5-15 cm water, 2 cm EPR, under heated humidification and using a mask/ interface of Patient's choice.     INTERPRETING PHYSICIAN:  Melvyn Novas, MD

## 2020-07-07 NOTE — Telephone Encounter (Signed)
I called pt. I advised pt that Dr. Vickey Huger reviewed their sleep study results and found that pt has mild to moderate OSA. Dr. Vickey Huger recommends that pt start auto CPAP. I reviewed PAP compliance expectations with the pt. Pt is agreeable to starting a CPAP. I advised pt that an order will be sent to a DME, [facility] and [facility] will call the pt within about one week after they file with the pt's insurance. [facility] [address]) will show the pt how to use the machine, fit for masks, and troubleshoot the CPAP if needed. A follow up appt will need to be made for insurance purposes with Dr. Vickey Huger or the NP 31-90 days from the date picks up the machine. Pt verbalized understanding to call and schedule this apt. A letter with all of this information in it will be sent to the pt as a reminder. Pt verbalized understanding of results. Pt had no questions at this time but was encouraged to call back if questions arise. I have sent the order to [facility] and have received confirmation that they have received the order.

## 2020-07-10 ENCOUNTER — Other Ambulatory Visit: Payer: Self-pay

## 2020-07-10 ENCOUNTER — Ambulatory Visit (HOSPITAL_BASED_OUTPATIENT_CLINIC_OR_DEPARTMENT_OTHER)
Admission: RE | Admit: 2020-07-10 | Discharge: 2020-07-10 | Disposition: A | Discharge status: Home / Self Care | Payer: BC Managed Care – PPO | Source: Ambulatory Visit | Attending: Cardiology | Admitting: Cardiology

## 2020-07-10 DIAGNOSIS — I10 Essential (primary) hypertension: Secondary | ICD-10-CM | POA: Insufficient documentation

## 2020-07-10 DIAGNOSIS — R0789 Other chest pain: Secondary | ICD-10-CM | POA: Insufficient documentation

## 2020-07-10 DIAGNOSIS — F17200 Nicotine dependence, unspecified, uncomplicated: Secondary | ICD-10-CM | POA: Diagnosis not present

## 2020-07-10 DIAGNOSIS — R0683 Snoring: Secondary | ICD-10-CM | POA: Diagnosis not present

## 2020-07-14 ENCOUNTER — Telehealth: Payer: Self-pay

## 2020-07-14 NOTE — Telephone Encounter (Signed)
-----   Message from Georgeanna Lea, MD sent at 07/14/2020  8:58 AM EDT ----- Normal renal arteries both sides.  No evidence of stenosis

## 2020-07-14 NOTE — Telephone Encounter (Signed)
Patient notified of test results 

## 2020-07-20 ENCOUNTER — Ambulatory Visit (INDEPENDENT_AMBULATORY_CARE_PROVIDER_SITE_OTHER): Payer: BC Managed Care – PPO | Admitting: Internal Medicine

## 2020-07-20 ENCOUNTER — Other Ambulatory Visit: Payer: Self-pay | Admitting: Internal Medicine

## 2020-07-20 ENCOUNTER — Encounter: Payer: Self-pay | Admitting: Internal Medicine

## 2020-07-20 ENCOUNTER — Other Ambulatory Visit: Payer: Self-pay

## 2020-07-20 VITALS — BP 176/108 | HR 86 | Temp 98.1°F | Resp 16 | Ht 66.0 in | Wt 270.2 lb

## 2020-07-20 DIAGNOSIS — F419 Anxiety disorder, unspecified: Secondary | ICD-10-CM

## 2020-07-20 DIAGNOSIS — I10 Essential (primary) hypertension: Secondary | ICD-10-CM

## 2020-07-20 DIAGNOSIS — G4733 Obstructive sleep apnea (adult) (pediatric): Secondary | ICD-10-CM

## 2020-07-20 MED ORDER — FLUOXETINE HCL 10 MG PO CAPS: ORAL_CAPSULE | ORAL | 1 refills | Status: DC

## 2020-07-20 MED ORDER — HYDROCHLOROTHIAZIDE 25 MG PO TABS: 25.0000 mg | ORAL_TABLET | Freq: Every day | ORAL | 0 refills | Status: DC

## 2020-07-20 NOTE — Patient Instructions (Addendum)
Increase hydrochlorothiazide to 25 mg every day  Go back on fluoxetine: Take 10 mg initially, then 20 mg every day  Check the  blood pressure twice a week BP GOAL is between 110/65 and  135/85. If it is consistently higher or lower, let me know   GO TO THE FRONT DESK, PLEASE SCHEDULE YOUR APPOINTMENTS Come back for blood work in 10 days  Come back for a checkup in 3 months

## 2020-07-20 NOTE — Progress Notes (Signed)
Subjective:    Patient ID: Mathew Daniels, male    DOB: 08-17-1980, 40 y.o.   MRN: [id number]  DOS:  07/20/2020 F/U Since last office visit he is doing well. Was seen by cardiology and neurology. Notes reviewed. Today we also talk about tobacco cessation, diet and exercise.  BP Readings from Last 3 Encounters:  07/20/20 (!) 176/108  06/04/20 (!) 162/98  06/02/20 (!) 200/111     Review of Systems See above   Past Medical History:  Diagnosis Date  . ADD   . ADHD 03/24/2008   Qualifier: Diagnosis of  By: Drue Novel MD, Nolon Rod ALLERGIC RHINITIS 03/24/2008   Qualifier: Diagnosis of  By: Drue Novel MD, Dalanie Kisner E.   . Allergic rhinitis 08/01/2013  . Annual physical exam 06/26/2012  . Anxiety 03/24/2008   Qualifier: Diagnosis of  By: Drue Novel MD, Lyric Rossano E.   . Atypical chest pain 06/20/2019  . Bronchospasm   . Dyslipidemia 06/20/2019  . Dyspnea on exertion 06/20/2019  . Environmental allergies   . Epistaxis 11/26/2018  . Essential hypertension 03/24/2008   Qualifier: Diagnosis of  By: Drue Novel MD, Nolon Rod   . H/O: depression   . HTN   . Morbid obesity (HCC) 08/01/2019  . PCP Notes>>>> 11/20/2017    Past Surgical History:  Procedure Laterality Date  . TENDON REPAIR Right 1998   ankle    Allergies as of 07/20/2020      Reactions   Amoxicillin    Blisters feet hand July 2013, took amoxicillin before w/o problems   Penicillins Other (See Comments)   Blisters feet hand July 2013, took amoxicillin before w/o problems Blisters feet hand July 2013, took amoxicillin before w/o problems Peeling on feet      Medication List       Accurate as of July 20, 2020 11:59 PM. If you have any questions, ask your nurse or doctor.        STOP taking these medications   hydrochlorothiazide 12.5 MG capsule Commonly known as: MICROZIDE Replaced by: hydrochlorothiazide 25 MG tablet Stopped by: Willow Ora, MD   traZODone 50 MG tablet Commonly known as: DESYREL Stopped by: Willow Ora, MD     TAKE these  medications   amLODipine 10 MG tablet Commonly known as: NORVASC Take 1 tablet (10 mg total) by mouth daily.   carvedilol 25 MG tablet Commonly known as: COREG Take 1 tablet (25 mg total) by mouth 2 (two) times daily with a meal.   Chantix Starting Month Pak 0.5 MG X 11 & 1 MG X 42 tablet Generic drug: varenicline Take one 0.5 mg tablet by mouth once daily for 3 days, then increase to one 0.5 mg tablet twice daily for 4 days, then increase to one 1 mg tablet twice daily.   varenicline 1 MG tablet Commonly known as: Chantix Take 1 tablet (1 mg total) by mouth 2 (two) times daily.   clonazePAM 0.5 MG tablet Commonly known as: KLONOPIN Take 1 tablet (0.5 mg total) by mouth 2 (two) times daily as needed for anxiety.   FLUoxetine 10 MG capsule Commonly known as: PROZAC Take 1 capsule by mouth daily for 1 week, then increase to 2 capsules daily Started by: Willow Ora, MD   hydrochlorothiazide 25 MG tablet Commonly known as: HYDRODIURIL Take 1 tablet (25 mg total) by mouth daily. Replaces: hydrochlorothiazide 12.5 MG capsule Started by: Willow Ora, MD   losartan 100 MG tablet Commonly known as: COZAAR Take 1 tablet (  100 mg total) by mouth daily. What changed: how much to take   multivitamin tablet Take 1 tablet by mouth daily. Optimen          Objective:   Physical Exam BP (!) 176/108 (BP Location: Left Arm, Patient Position: Sitting, Cuff Size: Normal)   Pulse 86   Temp 98.1 F (36.7 C) (Oral)   Resp 16   Ht 5\' 6"  (1.676 m)   Wt 270 lb 4 oz (122.6 kg)   SpO2 96%   BMI 43.62 kg/m  General:   Well developed, NAD, BMI noted. HEENT:  Normocephalic . Face symmetric, atraumatic Lungs:  CTA B Normal respiratory effort, no intercostal retractions, no accessory muscle use. Heart: RRR,  no murmur.  Lower extremities: Trace pretibial edema bilaterally  Skin: Not pale. Not jaundice Neurologic:  alert & oriented X3.  Speech normal, gait appropriate for age and  unassisted Psych--  Cognition and judgment appear intact.  Cooperative with normal attention span and concentration.  Behavior appropriate. No anxious or depressed appearing.      Assessment      ASSESSMENT  HTN dx ~ 34 (renal artery ultrasound normal/2022) Anxiety ADHD Tobacco  Morbid obesity + FH CAD: Saw cardiology, coronary calcium score: 0 (05-2019) OSA mild, strong REM sleep dependence (05-2020), Rx CPAP, other strategies not likely to help  PLAN: HTN: Ambulatory BPs in the morning in the 120-130s/ 80-90.  BPs  in the afternoon about 140/90-100.   BP rechecked by me today manually: 160/105 Reports good compliance with amlodipine, carvedilol, losartan, cardiology added HCTZ 12.5 mg when he was seen 06/04/2020. Plan: We will try to get better control, increase HCTZ to 25 mg, BMP in 1 week, continue monitoring BPs, reassess in 3 months OSA mild, strong REM sleep dependence per sleep study 05-2020, was  Rx CPAP, other strategies not likely to help.  CPAP treatment pending. Anxiety: Neurology recommended to stop fluoxetine because he was taking Chantix but now he is off it, go back on fluoxetine. Tobacco: Since the last visit, he took Chantix for 5 weeks, he quit tobacco, still occasionally dips.  We agreed on no further Chantix for now. Morbid obesity: Patient is determined to do better, planning for intermittent fasting and regular exercise. RTC 3 months    Time spent 30 minutes: Assessing a uncontrolled chronic problem (HTN) reviewed the chart regards sleep apnea, discussing anxiety treatment.  This visit occurred during the SARS-CoV-2 public health emergency.  Safety protocols were in place, including screening questions prior to the visit, additional usage of staff PPE, and extensive cleaning of exam room while observing appropriate contact time as indicated for disinfecting solutions.

## 2020-07-21 DIAGNOSIS — G4733 Obstructive sleep apnea (adult) (pediatric): Secondary | ICD-10-CM | POA: Insufficient documentation

## 2020-07-21 NOTE — Assessment & Plan Note (Signed)
HTN: Ambulatory BPs in the morning in the 120-130s/ 80-90.  BPs  in the afternoon about 140/90-100.   BP rechecked by me today manually: 160/105 Reports good compliance with amlodipine, carvedilol, losartan, cardiology added HCTZ 12.5 mg when he was seen 06/04/2020. Plan: We will try to get better control, increase HCTZ to 25 mg, BMP in 1 week, continue monitoring BPs, reassess in 3 months OSA mild, strong REM sleep dependence per sleep study 05-2020, was  Rx CPAP, other strategies not likely to help.  CPAP treatment pending. Anxiety: Neurology recommended to stop fluoxetine because he was taking Chantix but now he is off it, go back on fluoxetine. Tobacco: Since the last visit, he took Chantix for 5 weeks, he quit tobacco, still occasionally dips.  We agreed on no further Chantix for now. Morbid obesity: Patient is determined to do better, planning for intermittent fasting and regular exercise. RTC 3 months

## 2020-07-31 ENCOUNTER — Telehealth: Payer: Self-pay | Admitting: Internal Medicine

## 2020-07-31 MED ORDER — CLONAZEPAM 0.5 MG PO TABS: 0.5000 mg | ORAL_TABLET | Freq: Two times a day (BID) | ORAL | 0 refills | Status: DC | PRN

## 2020-07-31 NOTE — Telephone Encounter (Signed)
Requesting: clonazepam 0.5mg  Contract: 04/28/2020 UDS: 04/28/2020 Last Visit: 07/23/20 Next Visit: 10/19/20 Last Refill: 04/28/2020 #60 and 0RF  Please Advise

## 2020-07-31 NOTE — Telephone Encounter (Signed)
Medication: clonazePAM (KLONOPIN) 0.5 MG tablet [[id number]]    Has the patient contacted their pharmacy? No. (If no, request that the patient contact the pharmacy for the refill.) (If yes, when and what did the pharmacy advise?)  Preferred Pharmacy (with phone number or street name):  [id number] - [place], [place] - [address] AT [facility] [address] [facility] [address] Phone:  [phone]  Fax:  [phone]          Agent: Please be advised that RX refills may take up to 3 business days. We ask that you follow-up with your pharmacy.

## 2020-07-31 NOTE — Telephone Encounter (Signed)
PDMP okay, Rx sent 

## 2020-08-03 ENCOUNTER — Other Ambulatory Visit: Payer: BC Managed Care – PPO

## 2020-08-05 ENCOUNTER — Telehealth: Payer: Self-pay | Admitting: Internal Medicine

## 2020-08-05 MED ORDER — LOSARTAN POTASSIUM 100 MG PO TABS: 100.0000 mg | ORAL_TABLET | Freq: Every day | ORAL | 1 refills | Status: DC

## 2020-08-05 MED ORDER — AMLODIPINE BESYLATE 10 MG PO TABS: 10.0000 mg | ORAL_TABLET | Freq: Every day | ORAL | 1 refills | Status: DC

## 2020-08-05 NOTE — Telephone Encounter (Signed)
Medication: amLODipine (NORVASC) 10 MG tablet  losartan (COZAAR) 100 MG tablet   Has the patient contacted their pharmacy? Yes.   (If no, request that the patient contact the pharmacy for the refill.) (If yes, when and what did the pharmacy advise?)  [facility] pcp   Preferred Pharmacy (with phone number or street name):  [facility] #[id number] [place], [place] - [address] AT [facility] [address] & [facility]  [address] [place] [address]  Phone:  [phone] Fax:  [phone]  Agent: Please be advised that RX refills may take up to 3 business days. We ask that you follow-up with your pharmacy.

## 2020-08-05 NOTE — Telephone Encounter (Signed)
Rxs sent

## 2020-08-16 ENCOUNTER — Other Ambulatory Visit: Payer: Self-pay | Admitting: Internal Medicine

## 2020-09-07 ENCOUNTER — Ambulatory Visit: Payer: BC Managed Care – PPO | Admitting: Family Medicine

## 2020-09-07 ENCOUNTER — Telehealth: Payer: Self-pay

## 2020-09-07 NOTE — Telephone Encounter (Signed)
Spoke with patient regarding his CPAP f/u appt w Amy,NP. Pt states he has not received his cpap machine since they are still in back order. Pt was ok canceling appt at this time, has no concerns and will notify office once he receives his machine to sch cpap f/u.

## 2020-09-08 ENCOUNTER — Ambulatory Visit: Payer: BC Managed Care – PPO | Admitting: Cardiology

## 2020-10-19 ENCOUNTER — Ambulatory Visit: Payer: BC Managed Care – PPO | Admitting: Internal Medicine

## 2020-10-27 ENCOUNTER — Telehealth: Payer: Self-pay

## 2020-10-27 MED ORDER — FLUOXETINE HCL 20 MG PO CAPS
20.0000 mg | ORAL_CAPSULE | Freq: Every day | ORAL | 1 refills | Status: DC
Start: 2020-10-27 — End: 2021-02-17

## 2020-10-27 NOTE — Telephone Encounter (Signed)
Rx sent 

## 2020-10-27 NOTE — Telephone Encounter (Signed)
Okay to switch to fluoxetine 20 mg capsules 1 a day

## 2020-10-27 NOTE — Telephone Encounter (Signed)
Pt's insurance does not cover fluoxetine 10mg - 2 capsules daily. Requesting to switch to fluoxetine 20mg  capsules instead.

## 2020-11-19 ENCOUNTER — Other Ambulatory Visit: Payer: Self-pay | Admitting: Internal Medicine

## 2020-11-19 ENCOUNTER — Other Ambulatory Visit: Payer: Self-pay

## 2020-11-19 ENCOUNTER — Encounter: Payer: Self-pay | Admitting: Internal Medicine

## 2020-11-19 ENCOUNTER — Ambulatory Visit (INDEPENDENT_AMBULATORY_CARE_PROVIDER_SITE_OTHER): Payer: BC Managed Care – PPO | Admitting: Internal Medicine

## 2020-11-19 VITALS — BP 168/110 | HR 78 | Temp 98.1°F | Resp 16 | Ht 66.0 in | Wt 268.0 lb

## 2020-11-19 DIAGNOSIS — I10 Essential (primary) hypertension: Secondary | ICD-10-CM | POA: Diagnosis not present

## 2020-11-19 DIAGNOSIS — G4733 Obstructive sleep apnea (adult) (pediatric): Secondary | ICD-10-CM | POA: Diagnosis not present

## 2020-11-19 DIAGNOSIS — F419 Anxiety disorder, unspecified: Secondary | ICD-10-CM | POA: Diagnosis not present

## 2020-11-19 LAB — BASIC METABOLIC PANEL
BUN: 15 mg/dL (ref 6–23)
CO2: 32 mEq/L (ref 19–32)
Calcium: 9.4 mg/dL (ref 8.4–10.5)
Chloride: 99 mEq/L (ref 96–112)
Creatinine, Ser: 0.74 mg/dL (ref 0.40–1.50)
GFR: 111.98 mL/min (ref >60.00)
Glucose, Bld: 86 mg/dL (ref 70–99)
Potassium: 4.4 mEq/L (ref 3.5–5.1)
Sodium: 140 mEq/L (ref 135–145)

## 2020-11-19 MED ORDER — CHLORTHALIDONE 25 MG PO TABS: 25.0000 mg | ORAL_TABLET | Freq: Every day | ORAL | 0 refills | Status: DC

## 2020-11-19 NOTE — Patient Instructions (Addendum)
Recommend to proceed with covid #4 vaccine at your pharmacy.   For high blood pressure Stop hydrochlorothiazide Start chlorthalidone 25 mg every morning Other medications the same Come back in 2 weeks to recheck your blood  Check the  blood pressure daily BP GOAL is between 110/65 and  135/85. If it is consistently higher or lower, let me know     GO TO THE LAB : Get the blood work     GO TO THE FRONT DESK, PLEASE SCHEDULE YOUR APPOINTMENTS Come back for blood work in 2 weeks  Come back for a checkup in 2 months

## 2020-11-19 NOTE — Progress Notes (Signed)
Subjective:    Patient ID: Mathew Daniels, male    DOB: Mar 16, 1981, 40 y.o.   MRN: [id number]  DOS:  11/19/2020 Type of visit - description: Follow-up  Since the last visit he is not doing well. Emotionally feels poorly: Reports severe stress at work. Reports he is feeling that his health is in very bad spot  Thinks that he needs to do something ASAP to correct the above situation.  Ambulatory BPs remain elevated, never less than 140/100.  Review of Systems See above   Past Medical History:  Diagnosis Date   ADD    ADHD 03/24/2008   Qualifier: Diagnosis of  By: Drue Novel MD, Nolon Rod.    ALLERGIC RHINITIS 03/24/2008   Qualifier: Diagnosis of  By: Drue Novel MD, Nolon Rod.    Allergic rhinitis 08/01/2013   Annual physical exam 06/26/2012   Anxiety 03/24/2008   Qualifier: Diagnosis of  By: Drue Novel MD, Nolon Rod.    At risk for obstructive sleep apnea 06/02/2020   Atypical chest pain 06/20/2019   Bronchospasm    Class 3 severe obesity due to excess calories with body mass index (BMI) of 40.0 to 44.9 in adult [facility]) 06/02/2020   Dyslipidemia 06/20/2019   Dyspnea on exertion 06/20/2019   Environmental allergies    Epistaxis 11/26/2018   Essential hypertension 03/24/2008   Qualifier: Diagnosis of  By: Drue Novel MD, Nolon Rod.    H/O: depression    HTN    Morbid obesity (HCC) 08/01/2019   PCP Notes>>>> 11/20/2017   Smoking 06/04/2020   Snoring 06/02/2020    Past Surgical History:  Procedure Laterality Date   TENDON REPAIR Right 1998   ankle    Allergies as of 11/19/2020       Reactions   Amoxicillin    Blisters feet hand July 2013, took amoxicillin before w/o problems   Penicillins Other (See Comments)   Blisters feet hand July 2013, took amoxicillin before w/o problems Blisters feet hand July 2013, took amoxicillin before w/o problems Peeling on feet        Medication List        Accurate as of November 19, 2020 11:59 PM. If you have any questions, ask your nurse or doctor.          STOP taking  these medications    Chantix Starting Month Pak 0.5 MG X 11 & 1 MG X 42 tablet Generic drug: varenicline Stopped by: Willow Ora, MD   hydrochlorothiazide 25 MG tablet Commonly known as: HYDRODIURIL Stopped by: Willow Ora, MD   varenicline 1 MG tablet Commonly known as: Chantix Stopped by: Willow Ora, MD       TAKE these medications    amLODipine 10 MG tablet Commonly known as: NORVASC Take 1 tablet (10 mg total) by mouth daily.   carvedilol 25 MG tablet Commonly known as: COREG Take 1 tablet (25 mg total) by mouth 2 (two) times daily with a meal.   chlorthalidone 25 MG tablet Commonly known as: HYGROTON Take 1 tablet (25 mg total) by mouth daily. Started by: Willow Ora, MD   clonazePAM 0.5 MG tablet Commonly known as: KLONOPIN Take 1 tablet (0.5 mg total) by mouth 2 (two) times daily as needed for anxiety.   FLUoxetine 20 MG capsule Commonly known as: PROZAC Take 1 capsule (20 mg total) by mouth daily.   losartan 100 MG tablet Commonly known as: COZAAR Take 1 tablet (100 mg total) by mouth daily.   multivitamin tablet Take 1  tablet by mouth daily. Optimen           Objective:   Physical Exam BP (!) 168/110 (BP Location: Left Arm, Patient Position: Sitting, Cuff Size: Normal)   Pulse 78   Temp 98.1 F (36.7 C) (Oral)   Resp 16   Ht 5\' 6"  (1.676 m)   Wt 268 lb (121.6 kg)   SpO2 97%   BMI 43.26 kg/m  General:   Well developed, NAD, BMI noted. HEENT:  Normocephalic . Face symmetric, atraumatic Lungs:  CTA B Normal respiratory effort, no intercostal retractions, no accessory muscle use. Heart: RRR,  no murmur.  Lower extremities: no pretibial edema bilaterally  Skin: Not pale. Not jaundice Neurologic:  alert & oriented X3.  Speech normal, gait appropriate for age and unassisted Psych--  Cognition and judgment appear intact.  Cooperative with normal attention span and concentration.  Behavior appropriate. No anxious or depressed appearing.       Assessment      ASSESSMENT  HTN dx ~ 34 (renal artery ultrasound normal/2022) Anxiety ADHD Tobacco  Morbid obesity + FH CAD: Saw cardiology, coronary calcium score: 0 (05-2019) OSA mild, strong REM sleep dependence (05-2020), Rx CPAP, other strategies not likely to help  PLAN: HTN: Currently on amlodipine, carvedilol (occasionally forgets the second dose) and HCTZ.  Ambulatory BPs never less than 140/100. Plan: Switch HCTZ to chlorthalidone, BMP today and in 2 weeks, monitor BPs, low-salt diet encouraged.  See next OSA: Finally, he got an appointment to get set up for a CPAP.  Once he is started using the CPAP I am hopeful that the BP will decrease. Anxiety: Not doing well at all, a lot of stress related to work and also because he knows he needs to do a lot of work to promote better health. He already talked with his manager they are working into an agreement of give him a leave of absence or short-term disability until January 1.  If paperwork is needed I will completed. PHQ-9 today 13 (moderate). Morbid obesity: Since the last visit, he did not make any major changes however a week ago he started to go to the gym he is planning to talk to a nutritionist. Counseled about appropriate diet. RTC blood work in 2 weeks RTC checkup 2 months   Time spent with the patient 30 minutes, we had a long conversation about anxiety, listening therapy provided.  This visit occurred during the SARS-CoV-2 public health emergency.  Safety protocols were in place, including screening questions prior to the visit, additional usage of staff PPE, and extensive cleaning of exam room while observing appropriate contact time as indicated for disinfecting solutions.

## 2020-11-20 NOTE — Assessment & Plan Note (Signed)
HTN: Currently on amlodipine, carvedilol (occasionally forgets the second dose) and HCTZ.  Ambulatory BPs never less than 140/100. Plan: Switch HCTZ to chlorthalidone, BMP today and in 2 weeks, monitor BPs, low-salt diet encouraged.  See next OSA: Finally, he got an appointment to get set up for a CPAP.  Once he is started using the CPAP I am hopeful that the BP will decrease. Anxiety: Not doing well at all, a lot of stress related to work and also because he knows he needs to do a lot of work to promote better health. He already talked with his manager they are working into an agreement of give him a leave of absence or short-term disability until January 1.  If paperwork is needed I will completed. PHQ-9 today 13 (moderate). Morbid obesity: Since the last visit, he did not make any major changes however a week ago he started to go to the gym he is planning to talk to a nutritionist. Counseled about appropriate diet. RTC blood work in 2 weeks RTC checkup 2 months

## 2020-11-24 DIAGNOSIS — G4733 Obstructive sleep apnea (adult) (pediatric): Secondary | ICD-10-CM | POA: Diagnosis not present

## 2020-11-25 ENCOUNTER — Telehealth: Payer: Self-pay | Admitting: Neurology

## 2020-11-25 NOTE — Telephone Encounter (Signed)
Initial cpap f/u scheduled for 02/22/21.

## 2020-11-26 IMAGING — CT CT CARDIAC CORONARY ARTERY CALCIUM SCORE
3 series · 14 of 20 positions shown, 15 images · non-contrast
Comparison: None.
COMPARISON: None.

Addendum:
EXAM:
OVER-READ INTERPRETATION  CT CHEST

The following report is an over-read performed by radiologist Dr.
Jung Martinelli [REDACTED] on 06/28/2019. This
over-read does not include interpretation of cardiac or coronary
anatomy or pathology. The coronary calcium score interpretation by
the cardiologist is attached.
CLINICAL DATA: Risk stratification
Coronary Calcium Score
TECHNIQUE: The patient was scanned on a Siemens Somatom 64 slice scanner. Axial
non-contrast 3 mm slices were carried out through the heart. The
data set was analyzed on a dedicated work station and scored using
the Agatson method.

[Series 2: casc 3.0 bv41 2 bestdiast 68 % · axial · 0.50mm/px · z∈[+1150,+1219]mm · 4 of 39 slices shown, 5 images]
[im 8/39  vessel]
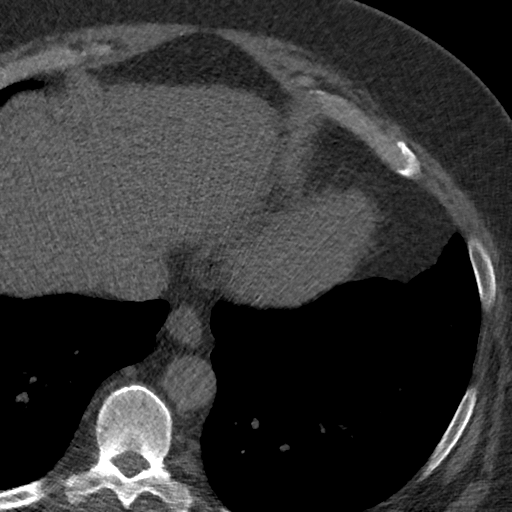
[im 8/39  lung]
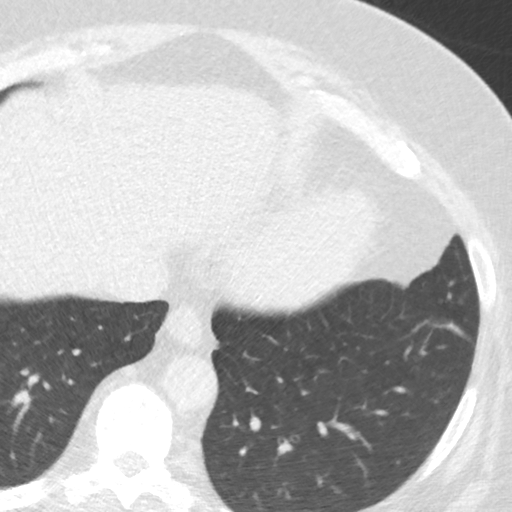
[im 16/39  vessel]
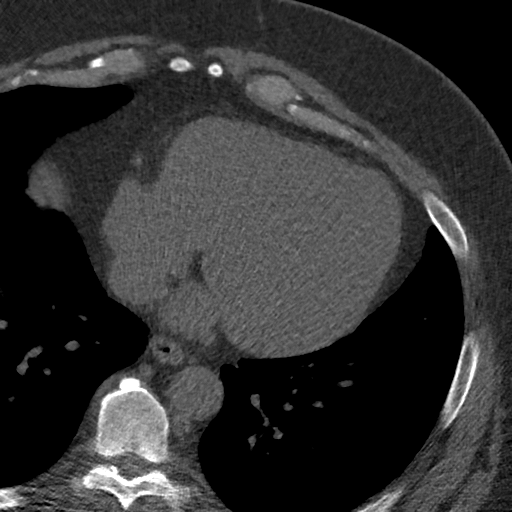
[im 23/39  vessel]
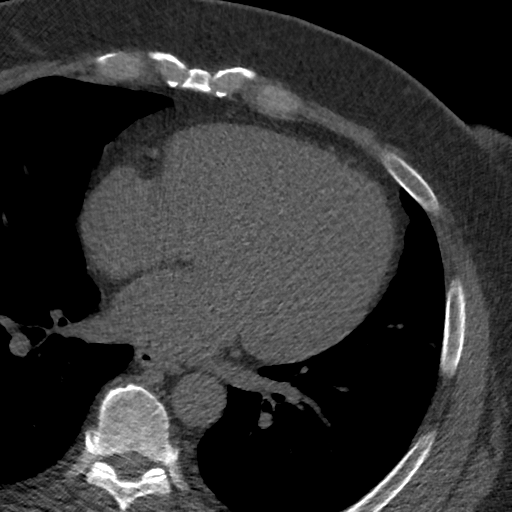
[im 31/39  vessel]
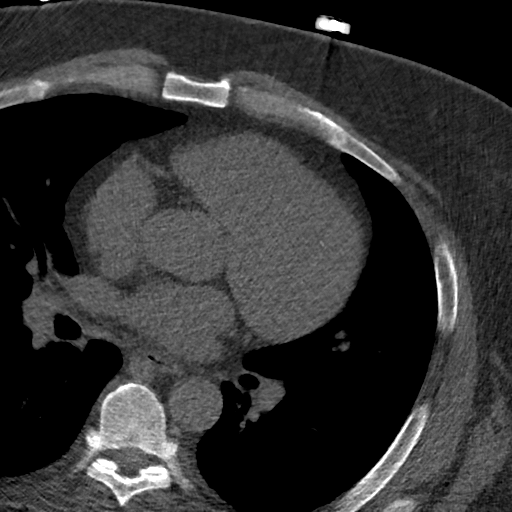

[Series 3: lung 68 % · axial · 0.71mm/px · z∈[+1147,+1222]mm · 5 of 39 slices shown]
[im 7/39  lung]
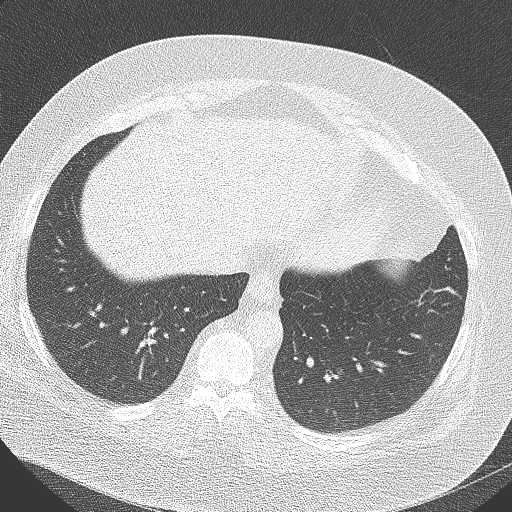
[im 13/39  lung]
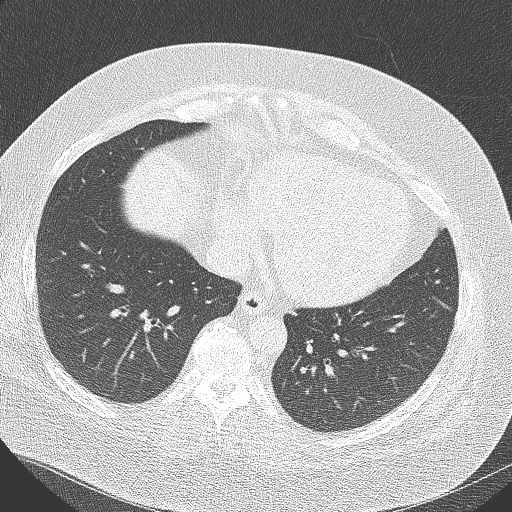
[im 20/39  lung]
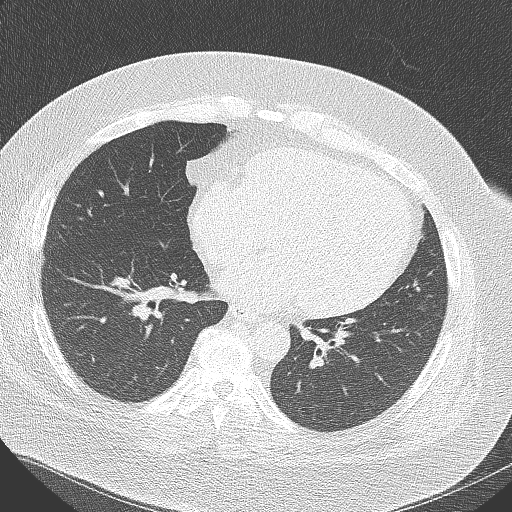
[im 26/39  lung]
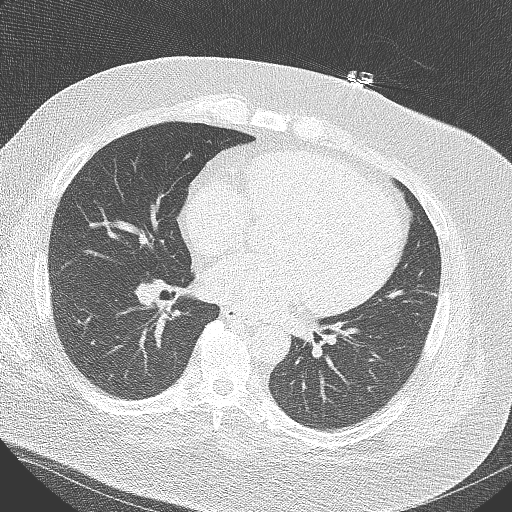
[im 32/39  lung]
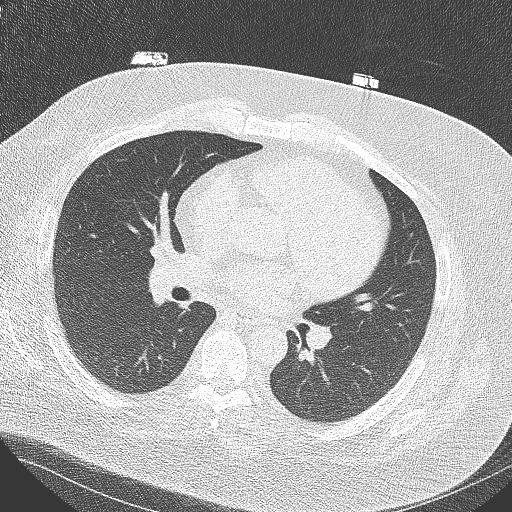

[Series 4: lung st 68 % · axial · 0.71mm/px · z∈[+1147,+1222]mm · 5 of 39 slices shown]
[im 7/39  lung]
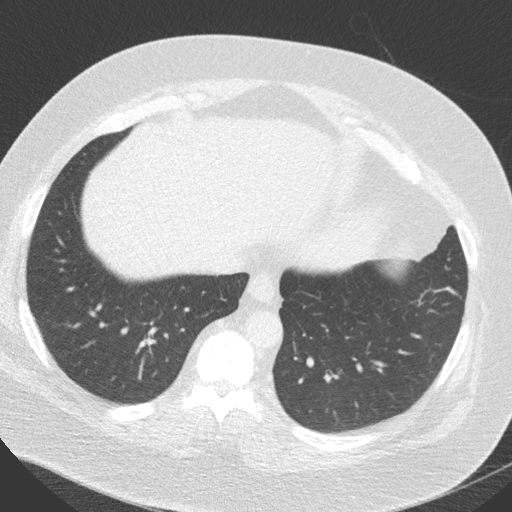
[im 13/39  lung]
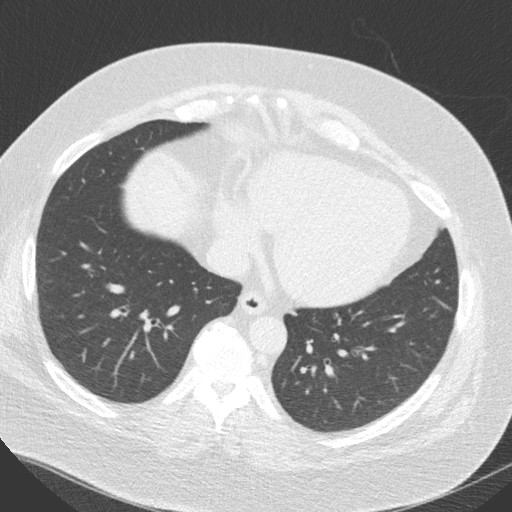
[im 20/39  lung]
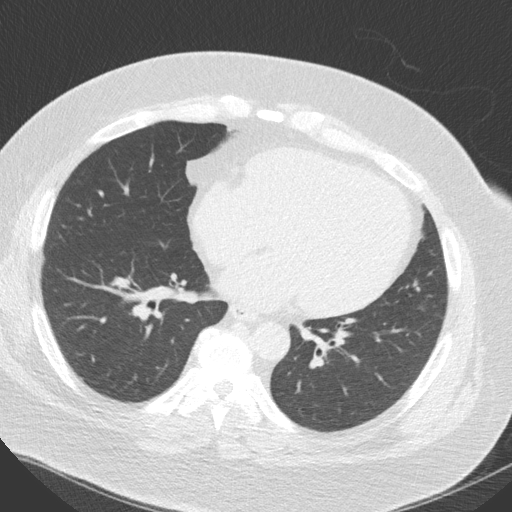
[im 26/39  lung]
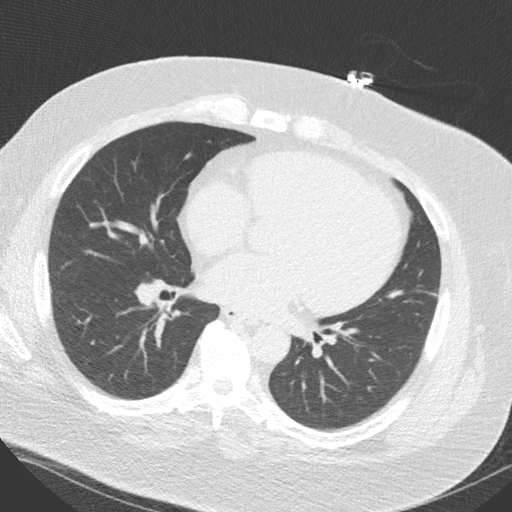
[im 32/39  lung]
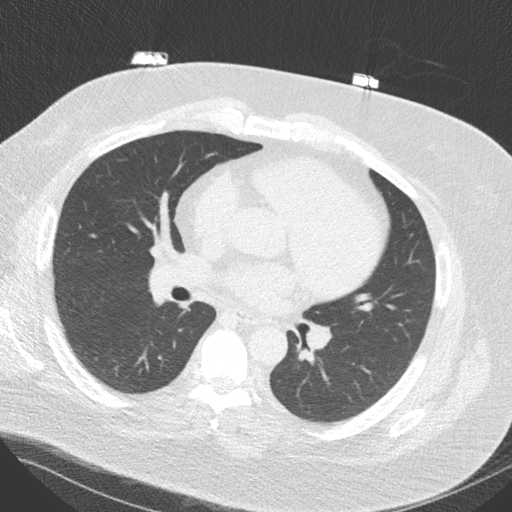

[14 of 20 positions shown; findings below may reference images not displayed]

FINDINGS: Within the visualized portions of the thorax there are no suspicious
appearing pulmonary nodules or masses, there is no acute
consolidative airspace disease, no pleural effusions, no
pneumothorax and no lymphadenopathy. Visualized portions of the
upper abdomen are unremarkable. There are no aggressive appearing
lytic or blastic lesions noted in the visualized portions of the
skeleton.
IMPRESSION: No significant incidental noncardiac findings are noted.
FINDINGS: Non-cardiac: See separate report from [REDACTED].

Ascending aorta: Upper normal measuring 38 mm; mildly dilated
pulmonary artery (32 mm).

Pericardium: Normal

Coronary arteries: Normal origin
IMPRESSION: Coronary calcium score of 0. This was 0 percentile for age and sex
matched control.

Semsur Frhan

*** End of Addendum ***
EXAM:
OVER-READ INTERPRETATION  CT CHEST

The following report is an over-read performed by radiologist Dr.
Jung Martinelli [REDACTED] on 06/28/2019. This
over-read does not include interpretation of cardiac or coronary
anatomy or pathology. The coronary calcium score interpretation by
the cardiologist is attached.
FINDINGS: Within the visualized portions of the thorax there are no suspicious
appearing pulmonary nodules or masses, there is no acute
consolidative airspace disease, no pleural effusions, no
pneumothorax and no lymphadenopathy. Visualized portions of the
upper abdomen are unremarkable. There are no aggressive appearing
lytic or blastic lesions noted in the visualized portions of the
skeleton.
IMPRESSION: No significant incidental noncardiac findings are noted.

## 2020-11-27 ENCOUNTER — Other Ambulatory Visit: Payer: Self-pay | Admitting: Internal Medicine

## 2020-12-17 ENCOUNTER — Other Ambulatory Visit: Payer: Self-pay | Admitting: Internal Medicine

## 2020-12-25 ENCOUNTER — Encounter: Payer: Self-pay | Admitting: Internal Medicine

## 2020-12-25 DIAGNOSIS — G4733 Obstructive sleep apnea (adult) (pediatric): Secondary | ICD-10-CM | POA: Diagnosis not present

## 2020-12-26 ENCOUNTER — Other Ambulatory Visit: Payer: Self-pay | Admitting: Internal Medicine

## 2020-12-28 ENCOUNTER — Ambulatory Visit: Payer: BC Managed Care – PPO | Admitting: Internal Medicine

## 2021-01-05 ENCOUNTER — Ambulatory Visit: Payer: BC Managed Care – PPO | Admitting: Internal Medicine

## 2021-01-14 DIAGNOSIS — F411 Generalized anxiety disorder: Secondary | ICD-10-CM | POA: Diagnosis not present

## 2021-01-14 DIAGNOSIS — F331 Major depressive disorder, recurrent, moderate: Secondary | ICD-10-CM | POA: Diagnosis not present

## 2021-01-19 DIAGNOSIS — F411 Generalized anxiety disorder: Secondary | ICD-10-CM | POA: Diagnosis not present

## 2021-01-19 DIAGNOSIS — F331 Major depressive disorder, recurrent, moderate: Secondary | ICD-10-CM | POA: Diagnosis not present

## 2021-01-21 DIAGNOSIS — F331 Major depressive disorder, recurrent, moderate: Secondary | ICD-10-CM | POA: Diagnosis not present

## 2021-01-21 DIAGNOSIS — F411 Generalized anxiety disorder: Secondary | ICD-10-CM | POA: Diagnosis not present

## 2021-01-24 DIAGNOSIS — G4733 Obstructive sleep apnea (adult) (pediatric): Secondary | ICD-10-CM | POA: Diagnosis not present

## 2021-02-03 ENCOUNTER — Other Ambulatory Visit: Payer: Self-pay | Admitting: Internal Medicine

## 2021-02-16 DIAGNOSIS — F331 Major depressive disorder, recurrent, moderate: Secondary | ICD-10-CM | POA: Diagnosis not present

## 2021-02-16 DIAGNOSIS — F411 Generalized anxiety disorder: Secondary | ICD-10-CM | POA: Diagnosis not present

## 2021-02-17 ENCOUNTER — Ambulatory Visit (INDEPENDENT_AMBULATORY_CARE_PROVIDER_SITE_OTHER): Payer: BC Managed Care – PPO | Admitting: Internal Medicine

## 2021-02-17 ENCOUNTER — Encounter: Payer: Self-pay | Admitting: Internal Medicine

## 2021-02-17 ENCOUNTER — Other Ambulatory Visit: Payer: Self-pay

## 2021-02-17 VITALS — BP 142/84 | HR 84 | Temp 98.1°F | Resp 16 | Ht 66.0 in | Wt 270.5 lb

## 2021-02-17 DIAGNOSIS — Z1159 Encounter for screening for other viral diseases: Secondary | ICD-10-CM | POA: Diagnosis not present

## 2021-02-17 DIAGNOSIS — I10 Essential (primary) hypertension: Secondary | ICD-10-CM | POA: Diagnosis not present

## 2021-02-17 DIAGNOSIS — F419 Anxiety disorder, unspecified: Secondary | ICD-10-CM

## 2021-02-17 DIAGNOSIS — E785 Hyperlipidemia, unspecified: Secondary | ICD-10-CM

## 2021-02-17 DIAGNOSIS — Z23 Encounter for immunization: Secondary | ICD-10-CM

## 2021-02-17 DIAGNOSIS — G4733 Obstructive sleep apnea (adult) (pediatric): Secondary | ICD-10-CM

## 2021-02-17 LAB — LIPID PANEL
Cholesterol: 180 mg/dL (ref 0–200)
HDL: 69.5 mg/dL (ref >39.00)
LDL Cholesterol: 92 mg/dL (ref 0–99)
NonHDL: 110.96
Total CHOL/HDL Ratio: 3
Triglycerides: 94 mg/dL (ref 0.0–149.0)
VLDL: 18.8 mg/dL (ref 0.0–40.0)

## 2021-02-17 LAB — BASIC METABOLIC PANEL
BUN: 16 mg/dL (ref 6–23)
CO2: 34 mEq/L — ABNORMAL HIGH (ref 19–32)
Calcium: 9.3 mg/dL (ref 8.4–10.5)
Chloride: 96 mEq/L (ref 96–112)
Creatinine, Ser: 0.74 mg/dL (ref 0.40–1.50)
GFR: 111.78 mL/min (ref >60.00)
Glucose, Bld: 100 mg/dL — ABNORMAL HIGH (ref 70–99)
Potassium: 3.4 mEq/L — ABNORMAL LOW (ref 3.5–5.1)
Sodium: 139 mEq/L (ref 135–145)

## 2021-02-17 NOTE — Patient Instructions (Signed)
Check the  blood pressure regularly as you are doing  BP GOAL is between 110/65 and  135/85. If it is consistently higher or lower, let me know      GO TO THE LAB : Get the blood work     GO TO THE FRONT DESK, PLEASE SCHEDULE YOUR APPOINTMENTS Come back for a physical exam in 4 to 5 months

## 2021-02-17 NOTE — Progress Notes (Signed)
Subjective:    Patient ID: Mathew Daniels, male    DOB: 04-01-1980, 40 y.o.   MRN: [id number]  DOS:  02/17/2021 Type of visit - description: f/u  Since the last office visit he is feeling well. Ambulatory BPs are okay.  Wt Readings from Last 3 Encounters:  02/17/21 270 lb 8 oz (122.7 kg)  11/19/20 268 lb (121.6 kg)  07/20/20 270 lb 4 oz (122.6 kg)     Review of Systems See above   Past Medical History:  Diagnosis Date   ADD    ADHD 03/24/2008   Qualifier: Diagnosis of  By: Drue Novel MD, Nolon Rod.    ALLERGIC RHINITIS 03/24/2008   Qualifier: Diagnosis of  By: Drue Novel MD, Nolon Rod.    Allergic rhinitis 08/01/2013   Annual physical exam 06/26/2012   Anxiety 03/24/2008   Qualifier: Diagnosis of  By: Drue Novel MD, Nolon Rod.    At risk for obstructive sleep apnea 06/02/2020   Atypical chest pain 06/20/2019   Bronchospasm    Class 3 severe obesity due to excess calories with body mass index (BMI) of 40.0 to 44.9 in adult [facility]) 06/02/2020   Dyslipidemia 06/20/2019   Dyspnea on exertion 06/20/2019   Environmental allergies    Epistaxis 11/26/2018   Essential hypertension 03/24/2008   Qualifier: Diagnosis of  By: Drue Novel MD, Nolon Rod.    H/O: depression    HTN    Morbid obesity (HCC) 08/01/2019   PCP Notes>>>> 11/20/2017   Smoking 06/04/2020   Snoring 06/02/2020    Past Surgical History:  Procedure Laterality Date   TENDON REPAIR Right 1998   ankle    Allergies as of 02/17/2021       Reactions   Amoxicillin    Blisters feet hand July 2013, took amoxicillin before w/o problems   Penicillins Other (See Comments)   Blisters feet hand July 2013, took amoxicillin before w/o problems Blisters feet hand July 2013, took amoxicillin before w/o problems Peeling on feet        Medication List        Accurate as of February 17, 2021 11:59 PM. If you have any questions, ask your nurse or doctor.          STOP taking these medications    hydrochlorothiazide 25 MG tablet Commonly known as:  HYDRODIURIL Stopped by: Willow Ora, MD       TAKE these medications    amLODipine 10 MG tablet Commonly known as: NORVASC Take 1 tablet (10 mg total) by mouth daily.   carvedilol 25 MG tablet Commonly known as: COREG Take 1 tablet (25 mg total) by mouth 2 (two) times daily with a meal.   chlorthalidone 25 MG tablet Commonly known as: HYGROTON TAKE 1 TABLET(25 MG) BY MOUTH DAILY   clonazePAM 0.5 MG tablet Commonly known as: KLONOPIN Take 1 tablet (0.5 mg total) by mouth 2 (two) times daily as needed for anxiety.   FLUoxetine 40 MG capsule Commonly known as: PROZAC Take 60 mg by mouth daily. What changed: Another medication with the same name was removed. Continue taking this medication, and follow the directions you see here. Changed by: Willow Ora, MD   losartan 100 MG tablet Commonly known as: COZAAR Take 1 tablet (100 mg total) by mouth daily.   multivitamin tablet Take 1 tablet by mouth daily. Optimen   traZODone 50 MG tablet Commonly known as: DESYREL Take by mouth.           Objective:  Physical Exam BP (!) 142/84 (BP Location: Left Arm, Patient Position: Sitting, Cuff Size: Normal)   Pulse 84   Temp 98.1 F (36.7 C) (Oral)   Resp 16   Ht 5\' 6"  (1.676 m)   Wt 270 lb 8 oz (122.7 kg)   SpO2 97%   BMI 43.66 kg/m  General:   Well developed, NAD, BMI noted. HEENT:  Normocephalic . Face symmetric, atraumatic Lungs:  CTA B Normal respiratory effort, no intercostal retractions, no accessory muscle use. Heart: RRR,  no murmur.  Lower extremities: no pretibial edema bilaterally  Skin: Not pale. Not jaundice Neurologic:  alert & oriented X3.  Speech normal, gait appropriate for age and unassisted Psych--  Cognition and judgment appear intact.  Cooperative with normal attention span and concentration.  Behavior appropriate. No anxious or depressed appearing.      Assessment      ASSESSMENT  HTN dx ~ 34 (renal artery ultrasound  normal/2022) Anxiety: Sees a counselor as of 01/2021 ADHD Tobacco  Morbid obesity + FH CAD: Saw cardiology, coronary calcium score: 0 (05-2019) OSA mild, strong REM sleep dependence (05-2020), Rx CPAP, other strategies not likely to help  PLAN: HTN: At the last visit, I recommend to switch HCTZ to chlorthalidone, follow-up BMP not done.  He also takes Carvedilol, amlodipine and losartan. Ambulatory BPs range from 120-140, improved from previous months. Plan: Continue same meds, check BMP and FLP. OSA: Still not able to use the CPAP due to mask intolerance, plans to see neurology Anxiety: He is actually working with a counselor, they increase his fluoxetine to 60 mg and started trazodone and he feels very well.  See last visit, he is taking a leave of absence from work but he is ready to go back --- letter provided. Morbid obesity: According to his a scale he has lost some weight, states that he has definitely changed his habits. Praised  Preventive care: Flu shot today, last COVID-vaccine 11-2020, rec bivalent booster  in the next few weeks. RTC CPX 4 to 5 months    This visit occurred during the SARS-CoV-2 public health emergency.  Safety protocols were in place, including screening questions prior to the visit, additional usage of staff PPE, and extensive cleaning of exam room while observing appropriate contact time as indicated for disinfecting solutions.

## 2021-02-19 LAB — HEPATITIS C ANTIBODY
Hepatitis C Ab: NONREACTIVE
SIGNAL TO CUT-OFF: 0.13 (ref <1.00)

## 2021-02-19 NOTE — Assessment & Plan Note (Signed)
ASSESSMENT  HTN dx ~ 34 (renal artery ultrasound normal/2022) Anxiety: Sees a counselor as of 01/2021 ADHD Tobacco  Morbid obesity + FH CAD: Saw cardiology, coronary calcium score: 0 (05-2019) OSA mild, strong REM sleep dependence (05-2020), Rx CPAP, other strategies not likely to help  PLAN: HTN: At the last visit, I recommend to switch HCTZ to chlorthalidone, follow-up BMP not done.  He also takes Carvedilol, amlodipine and losartan. Ambulatory BPs range from 120-140, improved from previous months. Plan: Continue same meds, check BMP and FLP. OSA: Still not able to use the CPAP due to mask intolerance, plans to see neurology Anxiety: He is actually working with a counselor, they increase his fluoxetine to 60 mg and started trazodone and he feels very well.  See last visit, he is taking a leave of absence from work but he is ready to go back --- letter provided. Morbid obesity: According to his a scale he has lost some weight, states that he has definitely changed his habits. Praised  Preventive care: Flu shot today, last COVID-vaccine 11-2020, rec bivalent booster  in the next few weeks. RTC CPX 4 to 5 months

## 2021-02-22 ENCOUNTER — Telehealth: Payer: Self-pay | Admitting: Internal Medicine

## 2021-02-22 ENCOUNTER — Ambulatory Visit: Payer: BC Managed Care – PPO | Admitting: Neurology

## 2021-02-22 MED ORDER — CLONAZEPAM 0.5 MG PO TABS: 0.5000 mg | ORAL_TABLET | Freq: Two times a day (BID) | ORAL | 1 refills | Status: AC | PRN

## 2021-02-22 NOTE — Telephone Encounter (Signed)
Requesting: clonazepam 0.5mg   Contract: 04/28/2020 UDS: 04/28/2020 Last Visit: 02/17/2021 Next Visit: 06/22/2021 Last Refill: 07/31/2020 #60 and 0RF  Please Advise

## 2021-02-22 NOTE — Telephone Encounter (Signed)
PDMP okay, Rx sent 

## 2021-02-23 ENCOUNTER — Encounter: Payer: Self-pay | Admitting: Neurology

## 2021-02-24 DIAGNOSIS — G4733 Obstructive sleep apnea (adult) (pediatric): Secondary | ICD-10-CM | POA: Diagnosis not present

## 2021-03-16 DIAGNOSIS — F411 Generalized anxiety disorder: Secondary | ICD-10-CM | POA: Diagnosis not present

## 2021-03-16 DIAGNOSIS — F331 Major depressive disorder, recurrent, moderate: Secondary | ICD-10-CM | POA: Diagnosis not present

## 2021-03-25 ENCOUNTER — Other Ambulatory Visit: Payer: Self-pay | Admitting: Internal Medicine

## 2021-03-26 DIAGNOSIS — G4733 Obstructive sleep apnea (adult) (pediatric): Secondary | ICD-10-CM | POA: Diagnosis not present

## 2021-04-01 ENCOUNTER — Ambulatory Visit (INDEPENDENT_AMBULATORY_CARE_PROVIDER_SITE_OTHER): Payer: BC Managed Care – PPO | Admitting: Family Medicine

## 2021-04-01 VITALS — BP 118/80 | HR 97 | Temp 98.5°F | Resp 18 | Ht 66.0 in | Wt 272.2 lb

## 2021-04-01 DIAGNOSIS — J029 Acute pharyngitis, unspecified: Secondary | ICD-10-CM | POA: Diagnosis not present

## 2021-04-01 LAB — POCT RAPID STREP A (OFFICE): Rapid Strep A Screen: POSITIVE — AB

## 2021-04-01 MED ORDER — AZITHROMYCIN 250 MG PO TABS: ORAL_TABLET | ORAL | 0 refills | Status: AC

## 2021-04-01 NOTE — Progress Notes (Signed)
[facility] at [facility] [address] [place], [place] [address] [phone]  Date:  04/01/2021   Name:  Mathew Daniels   DOB:  [birth year]   MRN:  [id number]  PCP:  Wanda Plump, MD    Chief Complaint: Sore Throat (X 2-3 days. Usually gets Strep at least twice a yeat. 2 neg covid tests.)   History of Present Illness:  Mathew Daniels is a 41 y.o. very pleasant male patient who presents with the following:  Pt notes he tends to get strep throat 2-3x a year-however, he has avoided strep for the last year or so He noted onset of possible sx yesterday- he noted a ST and body aches typical of strep throat for him He has not noted a fever He tested for covid and was negative He does have a cough   He also does have history of sleep apnea, notes difficulty using his CPAP machine.  He notes he did an ENT consultation perhaps 4 years ago, but insurance would not pay for tonsillectomy  Patient Active Problem List   Diagnosis Date Noted   OSA, strong REM sleep dependence DX 05-2020 07/21/2020   Smoking 06/04/2020   Snoring 06/02/2020   Non-restorative sleep 06/02/2020   Alcohol-induced insomnia (HCC) 06/02/2020   Class 3 severe obesity due to excess calories with body mass index (BMI) of 40.0 to 44.9 in adult (HCC) 06/02/2020   HTN    H/O: depression    Environmental allergies    Bronchospasm    ADD    Morbid obesity (HCC) 08/01/2019   Atypical chest pain 06/20/2019   Dyslipidemia 06/20/2019   Dyspnea on exertion 06/20/2019   Epistaxis 11/26/2018   PCP Notes>>>> 11/20/2017   Allergic rhinitis 08/01/2013   Annual physical exam 06/26/2012   Anxiety 03/24/2008   ADHD 03/24/2008   Essential hypertension 03/24/2008   ALLERGIC RHINITIS 03/24/2008    Past Medical History:  Diagnosis Date   ADD    ADHD 03/24/2008   Qualifier: Diagnosis of  By: Drue Novel MD, Nolon Rod.    ALLERGIC RHINITIS 03/24/2008   Qualifier: Diagnosis of  By: Drue Novel  MD, Nolon Rod.    Allergic rhinitis 08/01/2013   Annual physical exam 06/26/2012   Anxiety 03/24/2008   Qualifier: Diagnosis of  By: Drue Novel MD, Nolon Rod.    At risk for obstructive sleep apnea 06/02/2020   Atypical chest pain 06/20/2019   Bronchospasm    Class 3 severe obesity due to excess calories with body mass index (BMI) of 40.0 to 44.9 in adult [facility]) 06/02/2020   Dyslipidemia 06/20/2019   Dyspnea on exertion 06/20/2019   Environmental allergies    Epistaxis 11/26/2018   Essential hypertension 03/24/2008   Qualifier: Diagnosis of  By: Drue Novel MD, Nolon Rod.    H/O: depression    HTN    Morbid obesity (HCC) 08/01/2019   PCP Notes>>>> 11/20/2017   Smoking 06/04/2020   Snoring 06/02/2020    Past Surgical History:  Procedure Laterality Date   TENDON REPAIR Right 1998   ankle    Social History   Tobacco Use   Smoking status: Former   Smokeless tobacco: Former   Tobacco comments:    quit smoking 2012, quit chewing 2014  Substance Use Topics   Alcohol use: Yes    Comment: socially   Drug use: No    Family History  Problem Relation Age of Onset   CAD Father 33  MI x 2 CABG   Diabetes Father    Stroke Father    CAD Other        GF MI   Breast cancer Mother    Colon cancer Neg Hx    Cancer - Prostate Neg Hx     Allergies  Allergen Reactions   Amoxicillin     Blisters feet hand July 2013, took amoxicillin before w/o problems   Penicillins Other (See Comments)    Blisters feet hand July 2013, took amoxicillin before w/o problems Blisters feet hand July 2013, took amoxicillin before w/o problems Peeling on feet     Medication list has been reviewed and updated.  Current Outpatient Medications on File Prior to Visit  Medication Sig Dispense Refill   amLODipine (NORVASC) 10 MG tablet Take 1 tablet (10 mg total) by mouth daily. 90 tablet 1   carvedilol (COREG) 25 MG tablet TAKE 1 TABLET(25 MG) BY MOUTH TWICE DAILY WITH A MEAL 180 tablet 1   chlorthalidone (HYGROTON) 25 MG tablet TAKE  1 TABLET(25 MG) BY MOUTH DAILY 30 tablet 5   clonazePAM (KLONOPIN) 0.5 MG tablet Take 1 tablet (0.5 mg total) by mouth 2 (two) times daily as needed for anxiety. 60 tablet 1   FLUoxetine (PROZAC) 40 MG capsule Take 60 mg by mouth daily.     losartan (COZAAR) 100 MG tablet Take 1 tablet (100 mg total) by mouth daily. 90 tablet 1   Multiple Vitamin (MULTIVITAMIN) tablet Take 1 tablet by mouth daily. Optimen     traZODone (DESYREL) 50 MG tablet Take by mouth.     No current facility-administered medications on file prior to visit.    Review of Systems:  As per HPI- otherwise negative.   Physical Examination: Vitals:   04/01/21 1341  BP: 118/80  Pulse: 97  Resp: 18  Temp: 98.5 F (36.9 C)  SpO2: 97%   Vitals:   04/01/21 1341  Weight: 272 lb 3.2 oz (123.5 kg)  Height: 5\' 6"  (1.676 m)   Body mass index is 43.93 kg/m. Ideal Body Weight: Weight in (lb) to have BMI = 25: 154.6  GEN: no acute distress.  Obese, otherwise looks well HEENT: Atraumatic, Normocephalic.  Throat displays bilateral erythema and exudates without evidence of abscess Ears and Nose: No external deformity. CV: RRR, No M/G/R. No JVD. No thrill. No extra heart sounds. PULM: CTA B, no wheezes, crackles, rhonchi. No retractions. No resp. distress. No accessory muscle use. ABD: S, NT, ND, +BS. No rebound. No HSM. EXTR: No c/c/e PSYCH: Normally interactive. Conversant.   Results for orders placed or performed in visit on 04/01/21  POCT rapid strep A  Result Value Ref Range   Rapid Strep A Screen Positive (A) Negative    Assessment and Plan: Sore throat - Plan: POCT rapid strep A, azithromycin (ZITHROMAX) 250 MG tablet, Ambulatory referral to ENT  Patient seen today with strep pharyngitis. He has history of potentially severe penicillin/amoxicillin allergy.  As such, we will avoid penicillins and cephalosporins.  We will treat with azithromycin.  I advised patient there is some potential for resistance, he is  asked to alert me if not improving within 24 hours  Also made referral to ENT to revisit the idea of a tonsillectomy Signed 05/30/21, MD

## 2021-04-01 NOTE — Patient Instructions (Signed)
Good to see you today- I am sorry that you have strep throat!  We will treat you with antibiotics  Let me know if you are not improving within 24 hours  I will also refer you to ENT for consultation- possible tonsillectomy

## 2021-04-20 ENCOUNTER — Encounter: Payer: Self-pay | Admitting: Internal Medicine

## 2021-04-20 DIAGNOSIS — M545 Low back pain, unspecified: Secondary | ICD-10-CM

## 2021-04-22 ENCOUNTER — Ambulatory Visit (INDEPENDENT_AMBULATORY_CARE_PROVIDER_SITE_OTHER): Payer: BC Managed Care – PPO | Admitting: Internal Medicine

## 2021-04-22 ENCOUNTER — Encounter: Payer: Self-pay | Admitting: Internal Medicine

## 2021-04-22 VITALS — BP 126/84 | HR 103 | Temp 98.2°F | Resp 16 | Ht 66.0 in | Wt 277.2 lb

## 2021-04-22 DIAGNOSIS — M545 Low back pain, unspecified: Secondary | ICD-10-CM

## 2021-04-22 MED ORDER — PREDNISONE 10 MG PO TABS: ORAL_TABLET | ORAL | 0 refills | Status: DC

## 2021-04-22 NOTE — Progress Notes (Signed)
Subjective:    Patient ID: Mathew Daniels, male    DOB: September 24, 1980, 41 y.o.   MRN: [id number]  DOS:  04/22/2021 Type of visit - description: Acute  Having back problems since September 2022:  Is September develop major low back pain after doing a dead lift in the gym.  No radiation.  Symptoms lasted 5 days then stop.  3 weeks ago, developed again back pain, mostly on the left side. Last week, he developed on and off  radiation to the left leg, at times was very sharp and intense pain. It is better with laying down.  Worse with certain positions. Aleve seem to help some.  No fever chills No rash anywhere No bladder or bowel incontinence No paresthesias  Wt Readings from Last 3 Encounters:  04/22/21 277 lb 4 oz (125.8 kg)  04/01/21 272 lb 3.2 oz (123.5 kg)  02/17/21 270 lb 8 oz (122.7 kg)    Review of Systems See above   Past Medical History:  Diagnosis Date   ADD    ADHD 03/24/2008   Qualifier: Diagnosis of  By: Drue Novel MD, Nolon Rod.    ALLERGIC RHINITIS 03/24/2008   Qualifier: Diagnosis of  By: Drue Novel MD, Nolon Rod.    Allergic rhinitis 08/01/2013   Annual physical exam 06/26/2012   Anxiety 03/24/2008   Qualifier: Diagnosis of  By: Drue Novel MD, Nolon Rod.    At risk for obstructive sleep apnea 06/02/2020   Atypical chest pain 06/20/2019   Bronchospasm    Class 3 severe obesity due to excess calories with body mass index (BMI) of 40.0 to 44.9 in adult [facility]) 06/02/2020   Dyslipidemia 06/20/2019   Dyspnea on exertion 06/20/2019   Environmental allergies    Epistaxis 11/26/2018   Essential hypertension 03/24/2008   Qualifier: Diagnosis of  By: Drue Novel MD, Nolon Rod.    H/O: depression    HTN    Morbid obesity (HCC) 08/01/2019   PCP Notes>>>> 11/20/2017   Smoking 06/04/2020   Snoring 06/02/2020    Past Surgical History:  Procedure Laterality Date   TENDON REPAIR Right 1998   ankle    Current Outpatient Medications  Medication Instructions   amLODipine (NORVASC) 10 mg, Oral, Daily   carvedilol  (COREG) 25 MG tablet TAKE 1 TABLET(25 MG) BY MOUTH TWICE DAILY WITH A MEAL   chlorthalidone (HYGROTON) 25 MG tablet TAKE 1 TABLET(25 MG) BY MOUTH DAILY   clonazePAM (KLONOPIN) 0.5 mg, Oral, 2 times daily PRN   FLUoxetine (PROZAC) 60 mg, Oral, Daily   losartan (COZAAR) 100 mg, Oral, Daily   Multiple Vitamin (MULTIVITAMIN) tablet 1 tablet, Oral, Daily, Optimen    traZODone (DESYREL) 50 MG tablet Oral       Objective:   Physical Exam BP 126/84 (BP Location: Left Arm, Patient Position: Sitting, Cuff Size: Normal)    Pulse (!) 103    Temp 98.2 F (36.8 C) (Oral)    Resp 16    Ht 5\' 6"  (1.676 m)    Wt 277 lb 4 oz (125.8 kg)    SpO2 98%    BMI 44.75 kg/m  General:   Well developed, NAD, BMI noted. HEENT:  Normocephalic . Face symmetric, atraumatic MSK: No TTP at the lumbar spine or SI areas Lower extremities: no pretibial edema bilaterally  Skin: Not pale. Not jaundice Neurologic:  alert & oriented X3.  Speech normal, gait appropriate for age and unassisted.  Motor and DTR symmetric, straight leg test negative Psych--  Cognition and  judgment appear intact.  Cooperative with normal attention span and concentration.  Behavior appropriate. No anxious or depressed appearing.      Assessment      ASSESSMENT  HTN dx ~ 36 (renal artery ultrasound normal/2022) Anxiety: Sees a counselor as of 01/2021 ADHD Tobacco  Morbid obesity + FH CAD: Saw cardiology, coronary calcium score: 0 (05-2019) OSA mild, strong REM sleep dependence (05-2020), Rx CPAP, other strategies not likely to help  PLAN: Lumbalgia: As described above, has some features of radiculopathy, neuro exam normal, normal straight leg test. Recommend first conservative treatment with prednisone, Tylenol, Aleve with GI precautions.  Avoid heavy lifting. If no better refer to Ortho, see AVS. Has a CPX is scheduled for 05-2021   This visit occurred during the SARS-CoV-2 public health emergency.  Safety protocols were in place,  including screening questions prior to the visit, additional usage of staff PPE, and extensive cleaning of exam room while observing appropriate contact time as indicated for disinfecting solutions.

## 2021-04-22 NOTE — Assessment & Plan Note (Signed)
Lumbalgia: As described above, has some features of radiculopathy, neuro exam normal, normal straight leg test. Recommend first conservative treatment with prednisone, Tylenol, Aleve with GI precautions.  Avoid heavy lifting. If no better refer to Ortho, see AVS. Has a CPX is scheduled for 05-2021

## 2021-04-22 NOTE — Patient Instructions (Addendum)
Take prednisone as prescribed for few days  For pain: Tylenol  500 mg OTC 2 tabs a day every 8 hours as needed for pain You can also take Aleve now and then, always with food to prevent stomach irritation.  Avoid heavy lifting or dead lifts in the gym.  In the long-term consider doing physical therapy to strengthening your back muscles.  If you are not gradually better or the symptoms continue please call for orthopedic referral

## 2021-04-22 NOTE — Progress Notes (Signed)
Prednisone eRx failed 2x. Rx printed and faxed to [facility].

## 2021-04-23 ENCOUNTER — Other Ambulatory Visit: Payer: Self-pay

## 2021-04-23 MED ORDER — PREDNISONE 10 MG PO TABS: ORAL_TABLET | ORAL | 0 refills | Status: DC

## 2021-04-26 DIAGNOSIS — G4733 Obstructive sleep apnea (adult) (pediatric): Secondary | ICD-10-CM | POA: Diagnosis not present

## 2021-04-29 ENCOUNTER — Encounter: Payer: Self-pay | Admitting: Family Medicine

## 2021-04-29 ENCOUNTER — Ambulatory Visit: Payer: Self-pay

## 2021-04-29 ENCOUNTER — Ambulatory Visit (INDEPENDENT_AMBULATORY_CARE_PROVIDER_SITE_OTHER): Payer: BC Managed Care – PPO | Admitting: Family Medicine

## 2021-04-29 VITALS — BP 160/118 | Ht 66.0 in | Wt 277.0 lb

## 2021-04-29 DIAGNOSIS — M533 Sacrococcygeal disorders, not elsewhere classified: Secondary | ICD-10-CM

## 2021-04-29 MED ORDER — TRIAMCINOLONE ACETONIDE 40 MG/ML IJ SUSP
40.0000 mg | Freq: Once | INTRAMUSCULAR | Status: AC
  Administered 2021-04-29: 40 mg via INTRA_ARTICULAR

## 2021-04-29 NOTE — Patient Instructions (Signed)
Nice to meet you Please try heat  Please try the exercises   Please send me a message in MyChart with any questions or updates.  Please see me back in 2-3 weeks.   --Dr. Lynleigh Kovack  

## 2021-04-29 NOTE — Progress Notes (Signed)
°  Mathew Daniels - 41 y.o. male MRN [id number]  Date of birth: 03-11-1981  SUBJECTIVE:  Including CC & ROS.  No chief complaint on file.   Mathew Daniels is a 41 y.o. male that is presenting with acute left leg pain.  The pain seem to start of the left SI joint.  Has tried medications with limited improvement.  No injury inciting event.   Review of Systems See HPI   HISTORY: Past Medical, Surgical, Social, and Family History Reviewed & Updated per EMR.   Pertinent Historical Findings include:  Past Medical History:  Diagnosis Date   ADD    ADHD 03/24/2008   Qualifier: Diagnosis of  By: Drue Novel MD, Nolon Rod.    ALLERGIC RHINITIS 03/24/2008   Qualifier: Diagnosis of  By: Drue Novel MD, Nolon Rod.    Allergic rhinitis 08/01/2013   Annual physical exam 06/26/2012   Anxiety 03/24/2008   Qualifier: Diagnosis of  By: Drue Novel MD, Nolon Rod.    At risk for obstructive sleep apnea 06/02/2020   Atypical chest pain 06/20/2019   Bronchospasm    Class 3 severe obesity due to excess calories with body mass index (BMI) of 40.0 to 44.9 in adult [facility]) 06/02/2020   Dyslipidemia 06/20/2019   Dyspnea on exertion 06/20/2019   Environmental allergies    Epistaxis 11/26/2018   Essential hypertension 03/24/2008   Qualifier: Diagnosis of  By: Drue Novel MD, Nolon Rod.    H/O: depression    HTN    Morbid obesity (HCC) 08/01/2019   PCP Notes>>>> 11/20/2017   Smoking 06/04/2020   Snoring 06/02/2020    Past Surgical History:  Procedure Laterality Date   TENDON REPAIR Right 1998   ankle     PHYSICAL EXAM:  VS: BP (!) 160/118 (BP Location: Left Arm, Patient Position: Sitting)    Ht 5\' 6"  (1.676 m)    Wt 277 lb (125.6 kg)    BMI 44.71 kg/m  Physical Exam Gen: NAD, alert, cooperative with exam, well-appearing MSK:  Neurovascularly intact     Aspiration/Injection Procedure Note Mathew Daniels [birth year]  Procedure: Injection Indications: Left SI joint pain  Procedure Details Consent: Risks of procedure as well as the  alternatives and risks of each were explained to the (patient/caregiver).  Consent for procedure obtained. Time Out: Verified patient identification, verified procedure, site/side was marked, verified correct patient position, special equipment/implants available, medications/allergies/relevent history reviewed, required imaging and test results available.  Performed.  The area was cleaned with iodine and alcohol swabs.    The left SI joint was injected using 3 cc of 1% lidocaine on a 22-gauge 3-1/2 inch needle.  The syringe was switched and a mixture containing 1 cc's of 40 mg Kenalog and 4 cc's of 0.25% bupivacaine was injected.  Ultrasound was needed for visualization of the needle into the SI joint.  Ultrasound was used. Images were obtained in long views showing the injection.     A sterile dressing was applied.  Patient did tolerate procedure well.     ASSESSMENT & PLAN:   Sacroiliac joint dysfunction of left side Acutely occurring.  Having pain at the left SI joint with a radicular component. -Counseled on home exercise therapy and supportive care. -SI joint injection. -Could consider physical therapy or further imaging.

## 2021-04-29 NOTE — Addendum Note (Signed)
Addended by: Cresenciano Lick on: 04/29/2021 02:28 PM   Modules accepted: Orders

## 2021-04-29 NOTE — Assessment & Plan Note (Signed)
Acutely occurring.  Having pain at the left SI joint with a radicular component. -Counseled on home exercise therapy and supportive care. -SI joint injection. -Could consider physical therapy or further imaging.

## 2021-05-20 ENCOUNTER — Ambulatory Visit: Payer: BC Managed Care – PPO | Admitting: Family Medicine

## 2021-05-25 DIAGNOSIS — G4733 Obstructive sleep apnea (adult) (pediatric): Secondary | ICD-10-CM | POA: Diagnosis not present

## 2021-06-22 ENCOUNTER — Encounter: Payer: BC Managed Care – PPO | Admitting: Internal Medicine

## 2021-06-23 ENCOUNTER — Encounter: Payer: Self-pay | Admitting: Internal Medicine

## 2021-06-24 DIAGNOSIS — G4733 Obstructive sleep apnea (adult) (pediatric): Secondary | ICD-10-CM | POA: Diagnosis not present

## 2021-07-08 ENCOUNTER — Encounter: Payer: Self-pay | Admitting: Internal Medicine

## 2021-07-08 ENCOUNTER — Telehealth (INDEPENDENT_AMBULATORY_CARE_PROVIDER_SITE_OTHER): Payer: BC Managed Care – PPO | Admitting: Internal Medicine

## 2021-07-08 ENCOUNTER — Telehealth: Payer: Self-pay

## 2021-07-08 VITALS — BP 132/84 | Ht 66.0 in | Wt 269.0 lb

## 2021-07-08 DIAGNOSIS — G4700 Insomnia, unspecified: Secondary | ICD-10-CM | POA: Diagnosis not present

## 2021-07-08 DIAGNOSIS — Z6841 Body Mass Index (BMI) 40.0 and over, adult: Secondary | ICD-10-CM

## 2021-07-08 MED ORDER — TRAZODONE HCL 50 MG PO TABS: 100.0000 mg | ORAL_TABLET | Freq: Every evening | ORAL | 6 refills | Status: AC | PRN

## 2021-07-08 MED ORDER — WEGOVY 0.25 MG/0.5ML ~~LOC~~ SOAJ: 0.2500 mg | SUBCUTANEOUS | 0 refills | Status: AC

## 2021-07-08 NOTE — Telephone Encounter (Signed)
PA initiated via Covermymeds; KEY:  [id number]. Awaiting determination.  ?

## 2021-07-08 NOTE — Assessment & Plan Note (Signed)
Morbid obesity: ?The patient has been obese for the last 14 or 15 years.  Current BMI is 43. ?Request medication to help lose weight ?I agreed torx wegovy 0.25 milligrams weekly x4, then gradually increase to 0.5, 1, 1.7, 2.4 weekly ?Initial prescription sent. ?I strongly recommend to have a structured program if he is going to be successful.  Also start a exercise program once he started losing weight. ?He will call in 3 weeks and let us know how he is doing and if  doing well  will send the next dose of wegovy. ?Insomnia: On trazodone nightly, has been followed by psychiatry but going there has become very expensive d/t insurance and request me to prescribe trazodone.  I noted a interaction with fluoxetine but he has been taking both medications for a long time.  Rx sent. ?RTC 3 months ? ?

## 2021-07-08 NOTE — Progress Notes (Signed)
? ?Subjective:  ? ? Patient ID: Mathew Daniels, male    DOB: 05/15/1980, 41 y.o.   MRN: [id number] ? ?DOS:  07/08/2021 ?Type of visit - description: Virtual Visit via Video Note ? ?I connected with the above patient  by a video enabled telemedicine application and verified that I am speaking with the correct person using two identifiers. ?  ?THIS ENCOUNTER IS A VIRTUAL VISIT DUE TO COVID-19 - PATIENT WAS NOT SEEN IN THE OFFICE. PATIENT HAS CONSENTED TO VIRTUAL VISIT / TELEMEDICINE VISIT ?  ?Location of patient: home ? ?Location of provider: office ? ?Persons participating in the virtual visit: patient, provider  ? ?I discussed the limitations of evaluation and management by telemedicine and the availability of in person appointments. The patient expressed understanding and agreed to proceed. ? ?Follow-up ?The patient set this visit to discuss morbid obesity treatment. ?He has been obese for the last 14 and 15 years, highest weight was 300 pounds, he has been at times able to go down to 203 but in the last few years weight  has been consistently high. ? ?Review of Systems ?See above  ? ?Past Medical History:  ?Diagnosis Date  ? ADD   ? ADHD 03/24/2008  ? Qualifier: Diagnosis of  By: Larose Kells MD, [place] ALLERGIC RHINITIS 03/24/2008  ? Qualifier: Diagnosis of  By: Larose Kells MD, [place] Allergic rhinitis 08/01/2013  ? Annual physical exam 06/26/2012  ? Anxiety 03/24/2008  ? Qualifier: Diagnosis of  By: Larose Kells MD, [place] At risk for obstructive sleep apnea 06/02/2020  ? Atypical chest pain 06/20/2019  ? Bronchospasm   ? Class 3 severe obesity due to excess calories with body mass index (BMI) of 40.0 to 44.9 in adult [facility]) 06/02/2020  ? Dyslipidemia 06/20/2019  ? Dyspnea on exertion 06/20/2019  ? Environmental allergies   ? Epistaxis 11/26/2018  ? Essential hypertension 03/24/2008  ? Qualifier: Diagnosis of  By: Larose Kells MD, [place] H/O: depression   ? HTN   ? Morbid obesity ([place]) 08/01/2019  ? PCP Notes>>>> 11/20/2017  ? Smoking  06/04/2020  ? Snoring 06/02/2020  ? ? ?Past Surgical History:  ?Procedure Laterality Date  ? TENDON REPAIR Right 1998  ? ankle  ? ? ?Current Outpatient Medications  ?Medication Instructions  ? amLODipine (NORVASC) 10 mg, Oral, Daily  ? carvedilol (COREG) 25 MG tablet TAKE 1 TABLET(25 MG) BY MOUTH TWICE DAILY WITH A MEAL  ? chlorthalidone (HYGROTON) 25 MG tablet TAKE 1 TABLET(25 MG) BY MOUTH DAILY  ? clonazePAM (KLONOPIN) 0.5 mg, Oral, 2 times daily PRN  ? FLUoxetine (PROZAC) 60 mg, Oral, Daily  ? losartan (COZAAR) 100 mg, Oral, Daily  ? Multiple Vitamin (MULTIVITAMIN) tablet 1 tablet, Oral, Daily, Optimen   ? predniSONE (DELTASONE) 10 MG tablet 4 tablets x 2 days, 3 tabs x 2 days, 2 tabs x 2 days, 1 tab x 2 days  ? traZODone (DESYREL) 100 mg, Oral, At bedtime PRN  ? ? ?   ?Objective:  ? Physical Exam ?BP 132/84   Ht 5\' 6"  (1.676 m)   Wt 269 lb (122 kg)   BMI 43.42 kg/m?  ?This is a virtual video visit.  he looks well in no distress ?   ?Assessment   ? ?  ?ASSESSMENT  ?HTN dx ~ 36 (renal artery ultrasound normal/2022) ?Anxiety: Sees a counselor as of 01/2021 ?ADHD ?Tobacco  ?Morbid obesity ?+ FH CAD: Saw cardiology,  coronary calcium score: 0 (05-2019) ?OSA mild, strong REM sleep dependence (05-2020), Rx CPAP, other strategies not likely to help ? ?PLAN: ?Morbid obesity: ?The patient has been obese for the last 14 or 15 years.  Current BMI is 43. ?Request medication to help lose weight ?I agreed torx wegovy 0.25 milligrams weekly x4, then gradually increase to 0.5, 1, 1.7, 2.4 weekly ?Initial prescription sent. ?I strongly recommend to have a structured program if he is going to be successful.  Also start a exercise program once he started losing weight. ?He will call in 3 weeks and let us know how he is doing and if  doing well  will send the next dose of wegovy. ?Insomnia: On trazodone nightly, has been followed by psychiatry but going there has become very expensive d/t insurance and request me to prescribe  trazodone.  I noted a interaction with fluoxetine but he has been taking both medications for a long time.  Rx sent. ?RTC 3 months ? ? ?  ?I discussed the assessment and treatment plan with the patient. The patient was provided an opportunity to ask questions and all were answered. The patient agreed with the plan and demonstrated an understanding of the instructions. ?  ?The patient was advised to call back or seek an in-person evaluation if the symptoms worsen or if the condition fails to improve as anticipated. ?  ?

## 2021-07-09 NOTE — Telephone Encounter (Signed)
PA approved.  ? ?Effective from 07/08/2021 through 11/10/2021. ?

## 2021-07-15 ENCOUNTER — Encounter: Payer: Self-pay | Admitting: Internal Medicine

## 2021-07-25 DIAGNOSIS — G4733 Obstructive sleep apnea (adult) (pediatric): Secondary | ICD-10-CM | POA: Diagnosis not present

## 2021-07-27 ENCOUNTER — Other Ambulatory Visit: Payer: Self-pay | Admitting: Internal Medicine

## 2021-07-30 ENCOUNTER — Encounter: Payer: BC Managed Care – PPO | Admitting: Internal Medicine

## 2021-08-18 ENCOUNTER — Other Ambulatory Visit: Payer: Self-pay | Admitting: Internal Medicine

## 2021-08-21 ENCOUNTER — Other Ambulatory Visit: Payer: Self-pay | Admitting: Internal Medicine

## 2021-08-31 ENCOUNTER — Encounter: Payer: BC Managed Care – PPO | Admitting: Internal Medicine

## 2021-10-25 ENCOUNTER — Encounter: Payer: BC Managed Care – PPO | Admitting: Internal Medicine

## 2021-11-13 ENCOUNTER — Other Ambulatory Visit: Payer: Self-pay | Admitting: Internal Medicine

## 2021-11-16 ENCOUNTER — Other Ambulatory Visit: Payer: Self-pay | Admitting: Internal Medicine

## 2021-11-16 ENCOUNTER — Other Ambulatory Visit: Payer: Self-pay | Admitting: Family

## 2021-11-16 MED ORDER — CARVEDILOL 25 MG PO TABS: ORAL_TABLET | ORAL | 1 refills | Status: AC

## 2021-12-17 ENCOUNTER — Encounter: Payer: BC Managed Care – PPO | Admitting: Internal Medicine

## 2021-12-21 ENCOUNTER — Other Ambulatory Visit: Payer: Self-pay | Admitting: Family

## 2021-12-27 ENCOUNTER — Other Ambulatory Visit: Payer: Self-pay | Admitting: Internal Medicine

## 2021-12-27 MED ORDER — LOSARTAN POTASSIUM 100 MG PO TABS: 100.0000 mg | ORAL_TABLET | Freq: Every day | ORAL | 0 refills | Status: DC

## 2022-01-05 ENCOUNTER — Encounter: Payer: BC Managed Care – PPO | Admitting: Internal Medicine

## 2022-02-01 ENCOUNTER — Encounter: Payer: BC Managed Care – PPO | Admitting: Internal Medicine

## 2022-02-08 ENCOUNTER — Encounter: Payer: BC Managed Care – PPO | Admitting: Internal Medicine

## 2022-02-08 ENCOUNTER — Encounter: Payer: Self-pay | Admitting: Internal Medicine

## 2022-02-16 ENCOUNTER — Encounter: Payer: BC Managed Care – PPO | Admitting: Internal Medicine

## 2022-02-23 ENCOUNTER — Encounter: Payer: Self-pay | Admitting: Internal Medicine

## 2022-03-07 MED ORDER — VARENICLINE TARTRATE 0.5 MG PO TABS: ORAL_TABLET | ORAL | 0 refills | Status: AC

## 2022-03-22 ENCOUNTER — Other Ambulatory Visit: Payer: Self-pay | Admitting: Internal Medicine

## 2022-04-19 ENCOUNTER — Encounter: Payer: BC Managed Care – PPO | Admitting: Internal Medicine

## 2022-05-25 ENCOUNTER — Encounter: Payer: BC Managed Care – PPO | Admitting: Internal Medicine

## 2022-06-02 ENCOUNTER — Other Ambulatory Visit: Payer: Self-pay | Admitting: Internal Medicine

## 2022-07-11 ENCOUNTER — Encounter: Payer: Self-pay | Admitting: *Deleted

## 2023-03-18 ENCOUNTER — Other Ambulatory Visit: Payer: Self-pay | Admitting: Internal Medicine

## 2023-03-20 MED ORDER — LOSARTAN POTASSIUM 100 MG PO TABS: 100.0000 mg | ORAL_TABLET | Freq: Every day | ORAL | 0 refills | Status: AC

## 2023-06-22 ENCOUNTER — Encounter: Payer: Self-pay | Admitting: Internal Medicine

## 2023-06-22 ENCOUNTER — Telehealth: Payer: Self-pay | Admitting: Physician Assistant

## 2023-06-22 DIAGNOSIS — K047 Periapical abscess without sinus: Secondary | ICD-10-CM

## 2023-06-22 MED ORDER — CLINDAMYCIN HCL 300 MG PO CAPS: 300.0000 mg | ORAL_CAPSULE | Freq: Three times a day (TID) | ORAL | 0 refills | Status: AC

## 2023-06-22 NOTE — Progress Notes (Signed)

## 2023-06-22 NOTE — Progress Notes (Signed)
 I have spent 5 minutes in review of e-visit questionnaire, review and updating patient chart, medical decision making and response to patient.   Piedad Climes, PA-C
